# Patient Record
Sex: Female | Born: 1945
Health system: Southern US, Community
[De-identification: ages and names within clinical notes are randomized; demographics above are authoritative.]

## PROBLEM LIST (undated history)

## (undated) DIAGNOSIS — K648 Other hemorrhoids: Secondary | ICD-10-CM

## (undated) DIAGNOSIS — K219 Gastro-esophageal reflux disease without esophagitis: Secondary | ICD-10-CM

## (undated) DIAGNOSIS — E039 Hypothyroidism, unspecified: Secondary | ICD-10-CM

## (undated) DIAGNOSIS — C73 Malignant neoplasm of thyroid gland: Secondary | ICD-10-CM

## (undated) DIAGNOSIS — H409 Unspecified glaucoma: Secondary | ICD-10-CM

## (undated) DIAGNOSIS — E0590 Thyrotoxicosis, unspecified without thyrotoxic crisis or storm: Secondary | ICD-10-CM

## (undated) DIAGNOSIS — B029 Zoster without complications: Principal | ICD-10-CM

## (undated) HISTORY — DX: Gastro-esophageal reflux disease without esophagitis: K21.9

## (undated) HISTORY — DX: Thyrotoxicosis, unspecified without thyrotoxic crisis or storm: E05.90

## (undated) HISTORY — DX: Zoster without complications: B02.9

## (undated) HISTORY — DX: Other hemorrhoids: K64.8

## (undated) HISTORY — DX: Hypothyroidism, unspecified: E03.9

## (undated) HISTORY — DX: Unspecified glaucoma: H40.9

## (undated) HISTORY — DX: Malignant neoplasm of thyroid gland: C73

---

## 1985-06-04 HISTORY — PX: ABDOMINAL HYSTERECTOMY: SHX81

## 1999-03-16 ENCOUNTER — Encounter: Payer: Self-pay | Admitting: Neurosurgery

## 1999-03-16 ENCOUNTER — Ambulatory Visit (HOSPITAL_COMMUNITY): Admission: RE | Admit: 1999-03-16 | Discharge: 1999-03-16 | Payer: Self-pay | Admitting: Neurosurgery

## 1999-04-03 ENCOUNTER — Ambulatory Visit (HOSPITAL_COMMUNITY): Admission: RE | Admit: 1999-04-03 | Discharge: 1999-04-03 | Payer: Self-pay | Admitting: Neurosurgery

## 1999-04-03 ENCOUNTER — Encounter: Payer: Self-pay | Admitting: Neurosurgery

## 1999-04-17 ENCOUNTER — Encounter: Payer: Self-pay | Admitting: Neurosurgery

## 1999-04-17 ENCOUNTER — Ambulatory Visit (HOSPITAL_COMMUNITY): Admission: RE | Admit: 1999-04-17 | Discharge: 1999-04-17 | Payer: Self-pay | Admitting: Neurosurgery

## 1999-05-01 ENCOUNTER — Encounter: Payer: Self-pay | Admitting: Neurosurgery

## 1999-05-01 ENCOUNTER — Ambulatory Visit (HOSPITAL_COMMUNITY): Admission: RE | Admit: 1999-05-01 | Discharge: 1999-05-01 | Payer: Self-pay | Admitting: Neurosurgery

## 2000-10-31 ENCOUNTER — Other Ambulatory Visit: Admission: RE | Admit: 2000-10-31 | Discharge: 2000-10-31 | Payer: Self-pay | Admitting: Obstetrics and Gynecology

## 2001-11-25 ENCOUNTER — Other Ambulatory Visit: Admission: RE | Admit: 2001-11-25 | Discharge: 2001-11-25 | Payer: Self-pay | Admitting: *Deleted

## 2001-11-25 ENCOUNTER — Encounter: Admission: RE | Admit: 2001-11-25 | Discharge: 2001-11-25 | Payer: Self-pay | Admitting: *Deleted

## 2001-11-25 ENCOUNTER — Encounter: Payer: Self-pay | Admitting: *Deleted

## 2001-11-27 ENCOUNTER — Encounter: Payer: Self-pay | Admitting: *Deleted

## 2001-12-01 ENCOUNTER — Ambulatory Visit (HOSPITAL_COMMUNITY): Admission: RE | Admit: 2001-12-01 | Discharge: 2001-12-02 | Payer: Self-pay | Admitting: *Deleted

## 2001-12-01 ENCOUNTER — Encounter (INDEPENDENT_AMBULATORY_CARE_PROVIDER_SITE_OTHER): Payer: Self-pay | Admitting: *Deleted

## 2002-01-05 ENCOUNTER — Encounter: Payer: Self-pay | Admitting: *Deleted

## 2002-01-05 ENCOUNTER — Ambulatory Visit (HOSPITAL_COMMUNITY): Admission: RE | Admit: 2002-01-05 | Discharge: 2002-01-05 | Payer: Self-pay | Admitting: *Deleted

## 2002-01-09 ENCOUNTER — Encounter: Payer: Self-pay | Admitting: *Deleted

## 2002-01-09 ENCOUNTER — Ambulatory Visit (HOSPITAL_COMMUNITY): Admission: RE | Admit: 2002-01-09 | Discharge: 2002-01-09 | Payer: Self-pay | Admitting: *Deleted

## 2002-06-04 LAB — HM PAP SMEAR: HM Pap smear: NORMAL

## 2002-06-30 ENCOUNTER — Ambulatory Visit (HOSPITAL_COMMUNITY): Admission: RE | Admit: 2002-06-30 | Discharge: 2002-06-30 | Payer: Self-pay | Admitting: Gastroenterology

## 2002-06-30 DIAGNOSIS — K648 Other hemorrhoids: Secondary | ICD-10-CM

## 2002-06-30 HISTORY — DX: Other hemorrhoids: K64.8

## 2002-06-30 LAB — HM COLONOSCOPY

## 2004-10-23 ENCOUNTER — Encounter: Admission: RE | Admit: 2004-10-23 | Discharge: 2004-10-23 | Payer: Self-pay | Admitting: Family Medicine

## 2005-01-25 ENCOUNTER — Encounter: Admission: RE | Admit: 2005-01-25 | Discharge: 2005-01-25 | Payer: Self-pay | Admitting: Otolaryngology

## 2005-02-12 ENCOUNTER — Encounter: Admission: RE | Admit: 2005-02-12 | Discharge: 2005-02-12 | Payer: Self-pay | Admitting: Otolaryngology

## 2005-02-21 ENCOUNTER — Encounter: Admission: RE | Admit: 2005-02-21 | Discharge: 2005-02-21 | Payer: Self-pay | Admitting: Otolaryngology

## 2005-03-06 ENCOUNTER — Other Ambulatory Visit: Admission: RE | Admit: 2005-03-06 | Discharge: 2005-03-06 | Payer: Self-pay | Admitting: Obstetrics and Gynecology

## 2005-06-04 DIAGNOSIS — C73 Malignant neoplasm of thyroid gland: Secondary | ICD-10-CM

## 2005-06-04 HISTORY — DX: Malignant neoplasm of thyroid gland: C73

## 2005-09-13 ENCOUNTER — Encounter: Admission: RE | Admit: 2005-09-13 | Discharge: 2005-09-13 | Payer: Self-pay | Admitting: Otolaryngology

## 2005-12-02 DIAGNOSIS — E039 Hypothyroidism, unspecified: Secondary | ICD-10-CM

## 2005-12-02 HISTORY — PX: THYROIDECTOMY: SHX17

## 2005-12-02 HISTORY — DX: Hypothyroidism, unspecified: E03.9

## 2006-12-22 IMAGING — CR DG CHEST 2V
2 series · 2 of 2 positions shown · non-contrast
Comparison: Chest radiographs, 10/23/04.

CLINICAL DATA: Dry cough for several months.    Thyroid cancer history.
CHEST ? TWO VIEWS:

[view not recorded (1 of 2)]
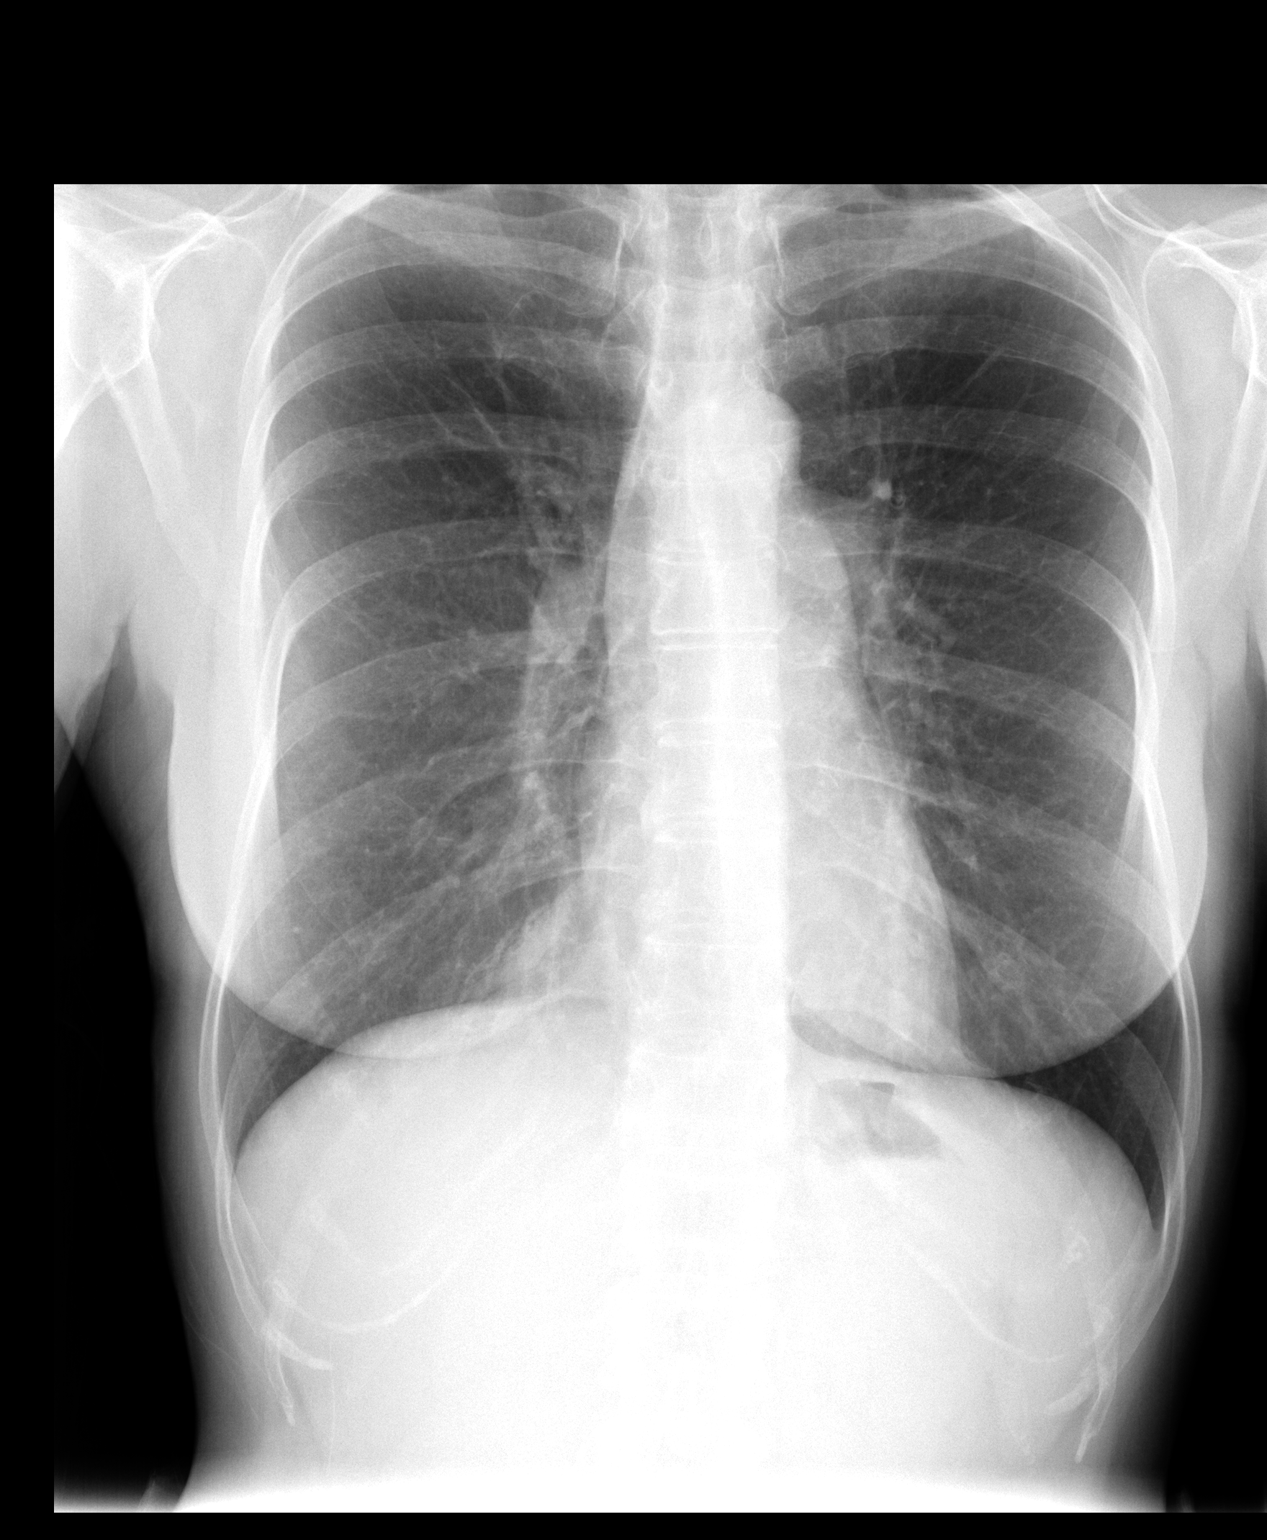

[view not recorded (2 of 2)]
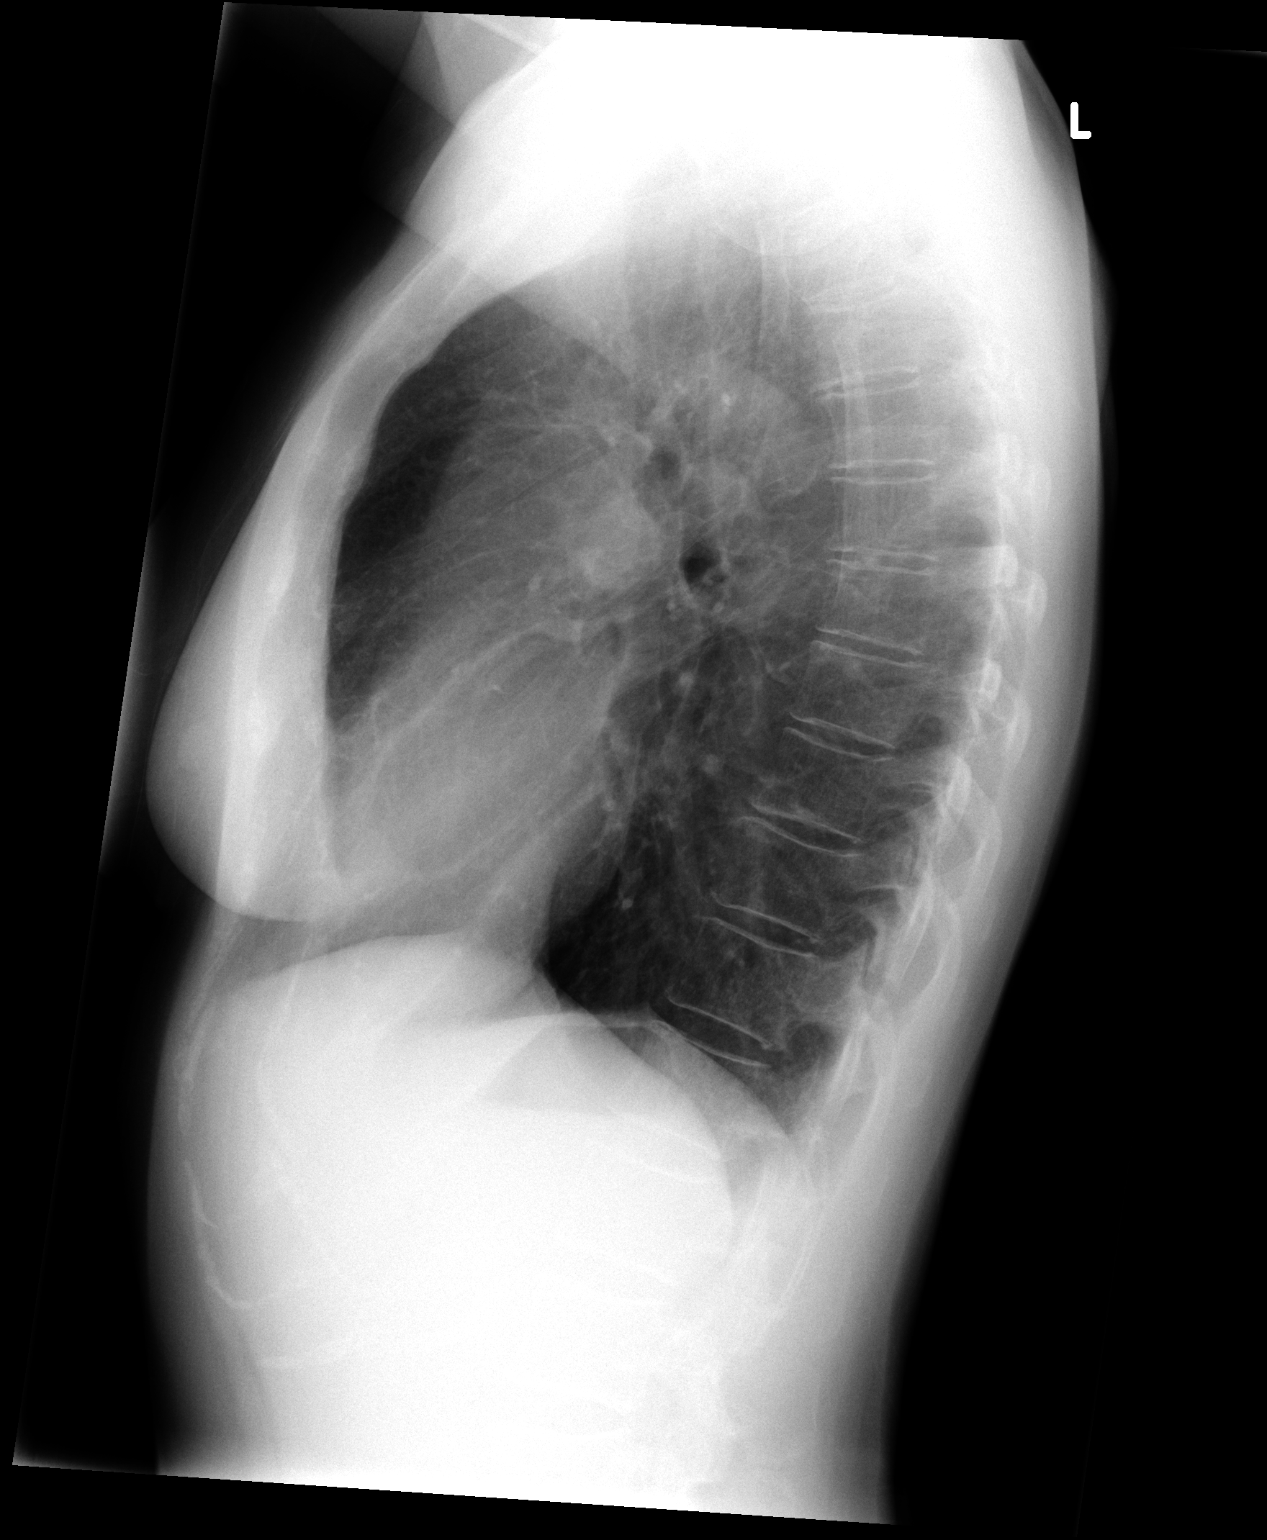

[2 of 2 positions shown; findings below may reference images not displayed]

FINDINGS: Cardiomediastinal silhouette is within normal limits.  Again noted is an opacity overlying the right cardiophrenic angle, unchanged.  No acute airspace opacity or pleural effusion is seen.  Osseous structures are intact.
IMPRESSION: 1.  No acute cardiopulmonary process. 
2.  Again noted is a mass-like opacity overlying the right cardiophrenic angle.  Although this may represent an epicardial fat pad, CT without contrast is again recommended for further characterization.

## 2006-12-25 ENCOUNTER — Encounter: Admission: RE | Admit: 2006-12-25 | Discharge: 2006-12-25 | Payer: Self-pay | Admitting: Otolaryngology

## 2009-06-22 ENCOUNTER — Encounter: Admission: RE | Admit: 2009-06-22 | Discharge: 2009-06-22 | Payer: Self-pay | Admitting: Gastroenterology

## 2009-09-02 ENCOUNTER — Encounter: Admission: RE | Admit: 2009-09-02 | Discharge: 2009-09-12 | Payer: Self-pay | Admitting: Endocrinology

## 2010-05-01 ENCOUNTER — Encounter (HOSPITAL_COMMUNITY)
Admission: RE | Admit: 2010-05-01 | Discharge: 2010-07-04 | Payer: Self-pay | Source: Home / Self Care | Attending: Endocrinology | Admitting: Endocrinology

## 2010-06-25 ENCOUNTER — Encounter: Payer: Self-pay | Admitting: Endocrinology

## 2010-10-20 NOTE — Op Note (Signed)
[facility]  Patient:    Barbara Parker, Barbara Parker Visit Number: [id number] MRN: [id number]          Service Type: DSU Location: [id number] Attending Physician:  Aundria Mems Dictated by:   Kathy Breach, M.D. Proc. Date: 12/01/01 Admit Date:  12/01/2001 Discharge Date: 12/02/2001                             Operative Report  PREOPERATIVE DIAGNOSIS:  Papillary carcinoma of thyroid.  OPERATIVE PROCEDURE:  Total thyroidectomy.  POSTOPERATIVE DIAGNOSIS:  Papillary carcinoma of thyroid per frozen section, which was in agreement with preoperative fine-needle aspirate.  FIRST SURGICAL ASSISTANT:   Jefry H. Pollyann Kennedy, M.D.  DESCRIPTION OF PROCEDURE:  With the patient under general orotracheal anesthesia, anterior neck, lower face, and upper chest were prepped and draped in a sterile fashion.  A curved, in a skin line, horizontal incision was made halfway between the cricoid cartilage and sternal notch just inferior to the palpable right thyroid lobe 2 cm nodule.  The incision was made through skin and subcutaneous tissue laterally platysma muscle.  Strap muscles were split vertically in the midline, elevating the strap muscles off of the right thyroid lobe beyond the capsule of the thyroid.  Vessels were clamped and tied with 4-0 silk ties as encountered dissecting the right thyroid lobe on its capsule.  The superior pole was dissected identifying, clamping, and divided superior thyroid artery and vessels.  The inferior thyroid vessel was divided at the inferior pole and the gland was rolled medial-wards, dissecting carefully on its undersurface identifying the recurrent laryngeal nerve in its expected position and keeping the dissection immediately on the capsule of the thyroid to avoid including parathyroid tissue.  The middle thyroid artery was clamped and tied in the immediate vicinity of the recurrent nerve.  The thyroid gland was rolled medially and was  sharply dissected off of the anterolateral tracheal wall having divided the thyroid isthmus at the junction of the isthmus with the left lobe.  The right lobe was submitted for frozen section to confirm suspected capillary carcinoma.  Frozen section reported this to be papillary carcinoma.  The left lobe of the thyroid was then removed in a similar fashion elevating the strap muscles off of the capsule of the left lobe of the thyroid, clamping, and tying with 4-0 silk ties the venous structures and individually, the inferior thyroid and superior thyroid vessels, rolling the left lobe medial-wards dissecting carefully on the deep surface of the thyroid gland capsule to avoid inclusion of parathyroid tissue.  The recurrent nerve was identified and preserved rolling the gland more medially separating it from the anterolateral tracheal wall. The wound was irrigated, hemostasis was complete.  A #7 Jackson-Pratt drain was placed through the wound and brought out through a small stab incision in the midline immediately below the incision line.  The strap muscles were reapproximated with interrupted 3-0 chromic catgut sutures.  The skin was then closed with a subcutaneous interrupted 3-0 chromic catgut sutures and then the skin was approximated with a running 4-0 nylon suture.  The skin margins were painted with benzoin and Steri-Strips were applied on the incision line. Blood loss for the procedure was estimated at 50 cc.  The patient tolerated the procedure well and was taken to the recovery room in stable general condition. Dictated by:   Kathy Breach, M.D. Attending Physician:  Aundria Mems DD:  12/01/01 TD:  12/02/01 Job: [id number]

## 2010-10-20 NOTE — Op Note (Signed)
Barbara Parker, Barbara Parker                              ACCOUNT NO.:  [id number]   MEDICAL RECORD NO.:  [id number]                   PATIENT TYPE:  AMB   LOCATION:  ENDO                                 FACILITY:  [facility]   PHYSICIAN:  Anselmo Rod, M.D.               DATE OF BIRTH:  September 29, 1945   DATE OF PROCEDURE:  06/30/2002  DATE OF DISCHARGE:                                 OPERATIVE REPORT   PROCEDURE:  Screening colonoscopy.   ENDOSCOPIST:  Anselmo Rod, M.D.   INSTRUMENT USED:  Olympus video colonoscope.   INDICATION FOR PROCEDURE:  A 65 year old white female with trace guaiac-  positive stool undergoing screening colonoscopy.  The patient has a history  of diarrhea alternating with constipation, question IBS.   PREPROCEDURE PREPARATION:  Informed consent was procured from the patient.  The patient had fasted for eight hours prior to the procedure and prepped  with a bottle of Miralax and Gatorade the night prior to the procedure.   PREPROCEDURE PHYSICAL:  VITAL SIGNS:  The patient had stable vital signs.  NECK:  Supple.  CHEST:  Clear to auscultation.  S1, S2 regular.  ABDOMEN:  Soft with normal bowel sounds.   DESCRIPTION OF PROCEDURE:  The patient was placed in the left lateral  decubitus position and sedated with 60 mg of Demerol and 6 mg of Versed  intravenously.  Once the patient was adequately sedate and maintained on low-  flow oxygen and continuous cardiac monitoring, the Olympus video colonoscope  was advanced from the rectum to the cecum and terminal ileum without  difficulty.  Except for small, nonbleeding internal hemorrhoids seen on  retroflexion, no other abnormalities were noted.  The patient tolerated the  procedure well without complications.  No masses or polyps were present.   IMPRESSION:  Normal colonoscopy up to the terminal ileum except for small,  nonbleeding internal hemorrhoids.   RECOMMENDATIONS:  1. A high-fiber diet has been recommended for  the patient along with liberal     fluid intake.  2.     Repeat CRC screening is recommended in the next five years unless the     patient develops any abnormal symptoms in the interim.  3. Outpatient follow-up on a p.r.n. basis.                                                 Anselmo Rod, M.D.    JNM/MEDQ  D:  07/01/2002  T:  07/01/2002  Job:  [id number]   cc:   Lacretia Leigh. Quintella Reichert, M.D.  [facility]  [place]  [place] [address]  Fax: [phone]   Laqueta Linden, M.D.  [address]  [place]  [place] [address]  Fax: [phone]

## 2011-03-26 ENCOUNTER — Encounter: Payer: Self-pay | Admitting: Medical

## 2011-03-26 ENCOUNTER — Ambulatory Visit (INDEPENDENT_AMBULATORY_CARE_PROVIDER_SITE_OTHER): Payer: BC Managed Care – PPO | Admitting: Medical

## 2011-03-26 VITALS — BP 100/70 | HR 68 | Temp 98.3°F | Resp 18 | Wt 131.0 lb

## 2011-03-26 DIAGNOSIS — J329 Chronic sinusitis, unspecified: Secondary | ICD-10-CM

## 2011-03-26 DIAGNOSIS — J40 Bronchitis, not specified as acute or chronic: Secondary | ICD-10-CM

## 2011-03-26 MED ORDER — BENZONATATE 100 MG PO CAPS: 100.0000 mg | ORAL_CAPSULE | Freq: Four times a day (QID) | ORAL | Status: DC | PRN

## 2011-03-26 MED ORDER — AMOXICILLIN 875 MG PO TABS: 875.0000 mg | ORAL_TABLET | Freq: Two times a day (BID) | ORAL | Status: AC

## 2011-03-26 NOTE — Progress Notes (Signed)
Subjective:   HPI  Barbara Parker is a 65 y.o. female who presents as a new patient with cold and cough.  She is scheduled to see Dr. Lynelle Doctor here to establish next week, but came down with these symptoms in the meantime.  Started with cold symptoms 2 weeks ago, but in the last few days worse cough, worse hacking all night long, feels tight in chest at times with the cough.  Has sinus pressure and having yellow green nasal discharge and sputum.  She notes some dull sinus pressure, some earache.  Using Sudafed, Mucinex, Benadryl, but nothing helps.   No sick contacts.  No other aggravating or relieving factors.    No other c/o.  Allergies  Allergen Reactions  . Codeine   . Prednisone     Nausea and vomiting    Pertinent past medical history: no hx/o allergies, asthma  Pertinent social history: nonsmoker  Reviewed their medical, surgical, family, social, medication, and allergy history and updated chart as appropriate.   The following portions of the patient's history were reviewed and updated as appropriate: allergies, current medications, past family history, past medical history, past social history, past surgical history and problem list.  Review of Systems Constitutional: -fever, -chills, -sweats, -unexpected -weight change,-fatigue ENT: +runny nose, +ear pain, -sore throat Cardiology:  -chest pain, -palpitations, -edema Respiratory: +cough, +shortness of breath, +wheezing Gastroenterology: -abdominal pain, +nausea, +vomiting, -diarrhea, -constipation Hematology: -bleeding or bruising problems Musculoskeletal: -arthralgias, -myalgias, -joint swelling, -back pain Ophthalmology: -vision changes Urology: -dysuria, -difficulty urinating, -hematuria, -urinary frequency, -urgency Neurology: +headache, -weakness, -tingling, -numbness    Objective:   Physical Exam  Filed Vitals:   03/26/11 1551  BP: 100/70  Pulse: 68  Temp: 98.3 F (36.8 C)  Resp: 18    General appearance:  alert, no distress, WD/WN, mildly ill appearing HEENT: normocephalic, sclerae anicteric, conjunctiva pink and moist, TMs pearly, nares with slight erythema and clear discharge mild erythema, pharynx normal, tonsils unremarkable Oral cavity: MMM, no lesions Neck: supple, no lymphadenopathy, no thyromegaly, no masses Heart: RRR, normal S1, S2, no murmurs Lungs: CTA bilaterally, no wheezes, rhonchi, or rales Pulses: 2+ symmetric   Assessment and Plan :    Encounter Diagnoses  Name Primary?  . Sinusitis Yes  . Bronchitis    Script for [employer] for cough, can c/t OTC Mucinex DM or Zyrtec, discussed symptomatic therapy, and call if not improving in 3-5 days.    Follow-up as scheduled next week with Dr. Lynelle Doctor for new patient/establish visit.

## 2011-03-26 NOTE — Patient Instructions (Signed)

## 2011-04-09 ENCOUNTER — Encounter: Payer: Self-pay | Admitting: Family Medicine

## 2011-04-09 ENCOUNTER — Ambulatory Visit (INDEPENDENT_AMBULATORY_CARE_PROVIDER_SITE_OTHER): Payer: BC Managed Care – PPO | Admitting: Family Medicine

## 2011-04-09 DIAGNOSIS — M810 Age-related osteoporosis without current pathological fracture: Secondary | ICD-10-CM

## 2011-04-09 DIAGNOSIS — R5381 Other malaise: Secondary | ICD-10-CM

## 2011-04-09 DIAGNOSIS — Z79899 Other long term (current) drug therapy: Secondary | ICD-10-CM

## 2011-04-09 DIAGNOSIS — Z23 Encounter for immunization: Secondary | ICD-10-CM

## 2011-04-09 DIAGNOSIS — Z0000 Encounter for general adult medical examination without abnormal findings: Secondary | ICD-10-CM

## 2011-04-09 DIAGNOSIS — R5383 Other fatigue: Secondary | ICD-10-CM

## 2011-04-09 LAB — CBC WITH DIFFERENTIAL/PLATELET
Basophils Absolute: 0 10*3/uL (ref 0.0–0.1)
Eosinophils Absolute: 0.1 10*3/uL (ref 0.0–0.7)
Eosinophils Relative: 1 % (ref 0–5)
Hemoglobin: 13.3 g/dL (ref 12.0–15.0)
Monocytes Absolute: 0.8 10*3/uL (ref 0.1–1.0)
Monocytes Relative: 9 % (ref 3–12)
Neutro Abs: 6.7 10*3/uL (ref 1.7–7.7)
RDW: 13.3 % (ref 11.5–15.5)

## 2011-04-09 LAB — LIPID PANEL
HDL: 87 mg/dL (ref >39)
Triglycerides: 53 mg/dL (ref <150)

## 2011-04-09 LAB — POCT URINALYSIS DIPSTICK
Glucose, UA: NEGATIVE
Ketones, UA: NEGATIVE
Nitrite, UA: NEGATIVE
Spec Grav, UA: 1.02
pH, UA: 5

## 2011-04-09 LAB — COMPREHENSIVE METABOLIC PANEL
ALT: 26 U/L (ref 0–35)
AST: 29 U/L (ref 0–37)
Albumin: 4.2 g/dL (ref 3.5–5.2)
Sodium: 140 mEq/L (ref 135–145)

## 2011-04-09 LAB — MAGNESIUM: Magnesium: 2.1 mg/dL (ref 1.5–2.5)

## 2011-04-09 NOTE — Patient Instructions (Signed)
HEALTH MAINTENANCE RECOMMENDATIONS:  It is recommended that you get at least 30 minutes of aerobic exercise at least 5 days/week (for weight loss, you may need as much as 60-90 minutes). This can be any activity that gets your heart rate up. This can be divided in 10-15 minute intervals if needed, but try and build up your endurance at least once a week.  Weight bearing exercise is also recommended twice weekly.  Eat a healthy diet with lots of vegetables, fruits and fiber.  "Colorful" foods have a lot of vitamins (ie green vegetables, tomatoes, red peppers, etc).  Limit sweet tea, regular sodas and alcoholic beverages, all of which has a lot of calories and sugar.  Up to 1 alcoholic drink daily may be beneficial for women (unless trying to lose weight, watch sugars).  Drink a lot of water.  Calcium recommendations are 1200-1500 mg daily (1500 mg for postmenopausal women or women without ovaries), and vitamin D 1000 IU daily.  This should be obtained from diet and/or supplements (vitamins), and calcium should not be taken all at once, but in divided doses.  Monthly self breast exams and yearly mammograms for women over the age of 35 is recommended.  Sunscreen of at least SPF 30 should be used on all sun-exposed parts of the skin when outside between the hours of 10 am and 4 pm (not just when at beach or pool, but even with exercise, golf, tennis, and yard work!)  Use a sunscreen that says "broad spectrum" so it covers both UVA and UVB rays, and make sure to reapply every 1-2 hours.  Remember to change the batteries in your smoke detectors when changing your clock times in the spring and fall.  Use your seat belt every time you are in a car, and please drive safely and not be distracted with cell phones and texting while driving.  Shingles vaccine--please check your insurance coverage, and schedule a nurse visit if desired.

## 2011-04-09 NOTE — Progress Notes (Signed)
Barbara Parker is a 65 y.o. female who presents for a complete physical.  She has the following concerns: Questions regarding immunizations, no other concerns Osteoporosis (vs osteopenia)--not taking calcium or D supplements.  Last DEXA 5-7 years ago  Immunization History  Administered Date(s) Administered  . Influenza Split 04/09/2011  . Tdap 04/09/2011  Believes last tetanus was >10 years ago Last Pap smear: 2004 Last mammogram: 03/2010, plans to schedule Last colonoscopy: 2007, Dr. Loreta Ave Last DEXA: 5-7 years ago (abnl per pt, ?osteopenia vs osteoporosis--pt doesn't know), last done at [facility] Dentist: twice yearly Ophtho: yearly Exercise: gym 2-3 times week; zumba and machines  Past Medical History  Diagnosis Date  . Hypothyroidism (acquired) 12/2005    history of thyroid cancer and thyroidectomy; Dr. Talmage Nap  . GERD (gastroesophageal reflux disease)   . Osteoporosis     Past Surgical History  Procedure Date  . Abdominal hysterectomy 1987    still has ovaries.  . Thyroidectomy 12/2005    History   Social History  . Marital Status: Married    Spouse Name: N/A    Number of Children: 2  . Years of Education: N/A   Occupational History  . works for [employer] (part-time)    Social History Main Topics  . Smoking status: Never Smoker   . Smokeless tobacco: Never Used  . Alcohol Use: Yes     glass or two of wine maybe twice a year.  . Drug Use: No  . Sexually Active: Yes -- Female partner(s)   Other Topics Concern  . Not on file   Social History Narrative   Lives with her husband.  Son in [place], Daughter in [place]. 6 grandkids    Family History  Problem Relation Age of Onset  . Dementia Mother   . Cancer Maternal Grandmother 71    breast cancer  . Cancer Paternal Grandmother     pancreatic    Current outpatient prescriptions:dexlansoprazole (DEXILANT) 60 MG capsule, Take 60 mg by mouth daily.  , Disp: , Rfl: ;  levothyroxine (SYNTHROID) 175 MCG tablet, Take  175 mcg by mouth daily.  , Disp: , Rfl:   Allergies  Allergen Reactions  . Codeine Anaphylaxis  . Prednisone     Nausea and vomiting   ROS: The patient denies anorexia, fever, weight changes, headaches,  vision changes, decreased hearing, ear pain, sore throat, breast concerns, chest pain, palpitations, dizziness, syncope, dyspnea on exertion, cough, swelling, nausea, vomiting, diarrhea, constipation, abdominal pain, melena, hematochezia, indigestion/heartburn, hematuria, incontinence, dysuria,  vaginal bleeding, discharge, odor or itch, genital lesions, joint pains, numbness, tingling, weakness, tremor, suspicious skin lesions, depression, anxiety, abnormal bleeding/bruising, or enlarged lymph nodes. +rare tingling in feet or hands, depending on position +hacky cough prior to being on PPI.  Denies heartburn.  Takes Dexilant every other day without any recurrence of cough. Some chronic constipation  PHYSICAL EXAM: BP 98/60  Pulse 64  Ht 5\' 6"  (1.676 m)  Wt 129 lb (58.514 kg)  BMI 20.82 kg/m2  General Appearance:    Alert, cooperative, no distress, appears stated age  Head:    Normocephalic, without obvious abnormality, atraumatic  Eyes:    PERRL, conjunctiva/corneas clear, EOM's intact, fundi    benign  Ears:    Normal TM's and external ear canals  Nose:   Nares normal, mucosa normal, no drainage or sinus   tenderness  Throat:   Lips, mucosa, and tongue normal; teeth and gums normal  Neck:   Supple, no lymphadenopathy;  thyroid:  no   enlargement/tenderness/nodules; no carotid   bruit or JVD  Back:    Spine nontender, no curvature, ROM normal, no CVA     tenderness  Lungs:     Clear to auscultation bilaterally without wheezes, rales or     ronchi; respirations unlabored  Chest Wall:    No tenderness or deformity   Heart:    Regular rate and rhythm, S1 and S2 normal, no murmur, rub   or gallop  Breast Exam:    No tenderness, masses, or nipple discharge or inversion.      No axillary  lymphadenopathy  Abdomen:     Soft, non-tender, nondistended, normoactive bowel sounds,    no masses, no hepatosplenomegaly  Genitalia:    Normal external genitalia without lesions.  BUS and vagina normal;   Uterus absent;  and adnexa not enlarged, not palpable, nontender, no masses.  Pap not performed  Rectal:    Normal tone, no masses or tenderness; guaiac negative stool  Extremities:   No clubbing, cyanosis or edema  Pulses:   2+ and symmetric all extremities  Skin:   Skin color, texture, turgor normal, no rashes or lesions  Lymph nodes:   Cervical, supraclavicular, and axillary nodes normal  Neurologic:   CNII-XII intact, normal strength, sensation and gait; reflexes 2+ and symmetric throughout          Psych:   Normal mood, affect, hygiene and grooming.    ASSESSMENT/PLAN:  1. Routine general medical examination at a health care facility  POCT Urinalysis Dipstick, Visual acuity screening, Lipid panel  2. Need for prophylactic vaccination and inoculation against influenza  Flu vaccine greater than or equal to 3yo preservative free IM  3. Need for Tdap vaccination  Tdap vaccine greater than or equal to 7yo IM  4. Osteoporosis  DG Bone Density, Vitamin D 25 hydroxy  5. Other malaise and fatigue  CBC with Differential, Comprehensive metabolic panel  6. Encounter for long-term (current) use of other medications  Magnesium   H/o osteoporosis (vs osteopenia)--DEXA at [facility].  Vitamin D and calcium recommendations reviewed.  Dietary sources reviewed.  GERD--controlled with PPI.  Continue every other day, or less if able.  Reviewed longterm risks of both untreated reflux, as well as longterm risks of PPI's.  Discussed monthly self breast exams and yearly mammograms; at least 30 minutes of aerobic activity at least 5 days/week; proper sunscreen use reviewed; healthy diet, including goals of calcium and vitamin D intake and alcohol recommendations (less than or equal to 1 drink/day) reviewed;  regular seatbelt use; changing batteries in smoke detectors.  Immunization recommendations discussed--Tdap and flu shot given.  Risks/benefits of Zostavax reviewed, and advised patient to check with her insurance regarding coverage. Pneumovax at age 21.  Colonoscopy recommendations reviewed--UTD. Hemoccult kit given

## 2011-04-10 ENCOUNTER — Encounter: Payer: Self-pay | Admitting: Family Medicine

## 2011-04-11 ENCOUNTER — Encounter: Payer: Self-pay | Admitting: Family Medicine

## 2011-04-19 ENCOUNTER — Other Ambulatory Visit (INDEPENDENT_AMBULATORY_CARE_PROVIDER_SITE_OTHER): Payer: BC Managed Care – PPO

## 2011-04-19 DIAGNOSIS — Z0000 Encounter for general adult medical examination without abnormal findings: Secondary | ICD-10-CM

## 2011-04-19 LAB — POC HEMOCCULT BLD/STL (HOME/3-CARD/SCREEN): Fecal Occult Blood, POC: NEGATIVE

## 2011-04-19 NOTE — Progress Notes (Signed)
Addended by: Leretha Dykes L on: 04/19/2011 12:17 PM   Modules accepted: Orders

## 2011-05-02 ENCOUNTER — Encounter: Payer: Self-pay | Admitting: *Deleted

## 2011-05-02 ENCOUNTER — Telehealth: Payer: Self-pay | Admitting: *Deleted

## 2011-05-02 NOTE — Telephone Encounter (Signed)
Left message for patient to call office and schedule OV with Dr.Knapp to discuss treatment options for osteoporosis found on DEXA.

## 2011-05-15 ENCOUNTER — Other Ambulatory Visit (INDEPENDENT_AMBULATORY_CARE_PROVIDER_SITE_OTHER): Payer: BC Managed Care – PPO

## 2011-05-15 DIAGNOSIS — Z2911 Encounter for prophylactic immunotherapy for respiratory syncytial virus (RSV): Secondary | ICD-10-CM

## 2011-05-15 DIAGNOSIS — Z0000 Encounter for general adult medical examination without abnormal findings: Secondary | ICD-10-CM

## 2011-07-16 ENCOUNTER — Encounter: Payer: Self-pay | Admitting: Family Medicine

## 2011-07-16 ENCOUNTER — Ambulatory Visit (INDEPENDENT_AMBULATORY_CARE_PROVIDER_SITE_OTHER): Payer: BC Managed Care – PPO | Admitting: Family Medicine

## 2011-07-16 DIAGNOSIS — R195 Other fecal abnormalities: Secondary | ICD-10-CM

## 2011-07-16 DIAGNOSIS — G4700 Insomnia, unspecified: Secondary | ICD-10-CM

## 2011-07-16 DIAGNOSIS — M810 Age-related osteoporosis without current pathological fracture: Secondary | ICD-10-CM

## 2011-07-16 MED ORDER — ZOLPIDEM TARTRATE 5 MG PO TABS: 5.0000 mg | ORAL_TABLET | Freq: Every evening | ORAL | Status: DC | PRN

## 2011-07-16 MED ORDER — CIPROFLOXACIN HCL 500 MG PO TABS: 500.0000 mg | ORAL_TABLET | Freq: Two times a day (BID) | ORAL | Status: AC

## 2011-07-16 MED ORDER — RISEDRONATE SODIUM 150 MG PO TABS: 150.0000 mg | ORAL_TABLET | ORAL | Status: DC

## 2011-07-16 NOTE — Progress Notes (Signed)
Patient presents to discuss her DEXA results.  Done at [facility] 04/18/2011.  Showed osteoporosis in spine and hip. She has never taken meds for osteoporosis, and admits to being afraid of them.  Had a friend who had fosamax "burn a whole in her esophagus".  Denies dysphagia, and reflux is well controlled on medications.  Also wanted to discuss her heme+ stool card (1/3 were positive).  She had colonoscopy 06/30/02 which showed small, nonbleeding internal hemorrhoids.  CBC was normal recently.  Denies seeing any blood in her stool, and denies any change in bowel habits.  Going to [place] on a mission trip with church.  In the past she has taken Ambien and Cipro (for potential GI illness) on trip.  She has taken both of these before without any problems. Has gotten the 5 mg of Ambien in the past. She states vaccines are UTD, has been to health dept in past for travel vaccines.  Past Medical History  Diagnosis Date  . Hypothyroidism (acquired) 12/2005    history of thyroid cancer and thyroidectomy; Dr. Talmage Nap  . GERD (gastroesophageal reflux disease)   . Osteoporosis   . Internal hemorrhoids 06/30/2002    Dr.Mann    Past Surgical History  Procedure Date  . Abdominal hysterectomy 1987    still has ovaries.  . Thyroidectomy 12/2005    History   Social History  . Marital Status: Married    Spouse Name: N/A    Number of Children: 2  . Years of Education: N/A   Occupational History  . works for [employer] (part-time)    Social History Main Topics  . Smoking status: Never Smoker   . Smokeless tobacco: Never Used  . Alcohol Use: Yes     glass or two of wine maybe twice a year.  . Drug Use: No  . Sexually Active: Yes -- Female partner(s)   Other Topics Concern  . Not on file   Social History Narrative   Lives with her husband.  Son in [place], Daughter in [place]. 6 grandkids    Family History  Problem Relation Age of Onset  . Dementia Mother   . Cancer Maternal Grandmother 34      breast cancer  . Cancer Paternal Grandmother     pancreatic   Meds: Synthroid and Dexilant  Allergies  Allergen Reactions  . Codeine Anaphylaxis  . Prednisone     Nausea and vomiting   ROS:  Denies fevers, URI symptoms, cough, shortness of breath, chest pain, dysphagia, heartburn, change in bowel habits, abdominal pain, or other concern.  PHYSICAL EXAM: BP 120/60  Pulse 64  Ht 5\' 6"  (1.676 m)  Wt 133 lb (60.328 kg)  BMI 21.47 kg/m2 Well developed, pleasant, thin female in no distress Remainder of exam was limited to discussion counseling.  ASSESSMENT/PLAN: 1. Osteoporosis  risedronate (ACTONEL) 150 MG tablet  2. Insomnia  zolpidem (AMBIEN) 5 MG tablet  3. Heme + stool     Reviewed risks and benefits of bisphosphonates and Evista.  Willing to try a monthly bisphosphonate--Actonel samples given.  Discussed proper way to take.  Will call if she is having side effects. Reviewed calcium and vitamin D recommendations (previously had normal Vitamin D level), and regular weight-bearing exercise.  1/3 stool cards +, with normal CBC and known hemorrhoids.  Continue with plan for colonoscopy in 2014, as previously recommended. Recommend it be done early if having any ongoing/worsening blood in stool, change in bowel habits, weight loss, or other symptoms/concerns.  Cipro to have prn traveler's diarrhea or UTI Ambien 5mg  as needed, aware of side effects  Discussed decongestants, Mucinex and sinus rinses prn for congestion if she gets sick while away.

## 2011-07-16 NOTE — Patient Instructions (Signed)
Remember to take Actonel on empty stomach, with a full glass of water.  Do not eat or recline for 30-60 minutes after, in order to adequately absorb medications, and reduce risk for side effects or adverse consequences.  If you find that Barbara Parker is less expensive, then call and we can send Rx to pharmacy.  Otherwise, try the two samples and if you are tolerating without side effects, call for prescription to be sent to pharmacy

## 2011-07-17 DIAGNOSIS — M810 Age-related osteoporosis without current pathological fracture: Secondary | ICD-10-CM | POA: Insufficient documentation

## 2011-10-04 ENCOUNTER — Emergency Department (INDEPENDENT_AMBULATORY_CARE_PROVIDER_SITE_OTHER): Payer: BC Managed Care – PPO

## 2011-10-04 ENCOUNTER — Encounter (HOSPITAL_COMMUNITY): Payer: Self-pay | Admitting: *Deleted

## 2011-10-04 ENCOUNTER — Emergency Department (HOSPITAL_COMMUNITY)
Admission: EM | Admit: 2011-10-04 | Discharge: 2011-10-04 | Disposition: A | Discharge status: Home / Self Care | Payer: BC Managed Care – PPO | Source: Home / Self Care | Attending: Family Medicine | Admitting: Family Medicine

## 2011-10-04 DIAGNOSIS — S8002XA Contusion of left knee, initial encounter: Secondary | ICD-10-CM

## 2011-10-04 DIAGNOSIS — W010XXA Fall on same level from slipping, tripping and stumbling without subsequent striking against object, initial encounter: Secondary | ICD-10-CM

## 2011-10-04 DIAGNOSIS — S8000XA Contusion of unspecified knee, initial encounter: Secondary | ICD-10-CM

## 2011-10-04 MED ORDER — TRAMADOL HCL 50 MG PO TABS: 50.0000 mg | ORAL_TABLET | Freq: Four times a day (QID) | ORAL | Status: AC | PRN

## 2011-10-04 NOTE — ED Provider Notes (Signed)
History     CSN: [id number]  Arrival date & time 10/04/11  5284   First MD Initiated Contact with Patient 10/04/11 1858      Chief Complaint  Patient presents with  . Knee Injury    (Consider location/radiation/quality/duration/timing/severity/associated sxs/prior treatment) Patient is a 66 y.o. female presenting with knee pain. The history is provided by the patient.  Knee Pain This is a new problem. The current episode started 1 to 2 hours ago (slipped in kitchen on wet spot forward onto left knee, pain since.). The problem has not changed since onset.   Past Medical History  Diagnosis Date  . Hypothyroidism (acquired) 12/2005    history of thyroid cancer and thyroidectomy; Dr. Talmage Nap  . GERD (gastroesophageal reflux disease)   . Osteoporosis   . Internal hemorrhoids 06/30/2002    Dr.Mann    Past Surgical History  Procedure Date  . Abdominal hysterectomy 1987    still has ovaries.  . Thyroidectomy 12/2005    Family History  Problem Relation Age of Onset  . Dementia Mother   . Cancer Maternal Grandmother 68    breast cancer  . Cancer Paternal Grandmother     pancreatic    History  Substance Use Topics  . Smoking status: Never Smoker   . Smokeless tobacco: Never Used  . Alcohol Use: Yes     glass or two of wine maybe twice a year.    OB History    Grav Para Term Preterm Abortions TAB SAB Ect Mult Living   2 2 2              Review of Systems  Constitutional: Negative.   Gastrointestinal: Negative.   Musculoskeletal: Positive for joint swelling and gait problem. Negative for back pain.  Neurological: Negative.     Allergies  Codeine and Prednisone  Home Medications   Current Outpatient Rx  Name Route Sig Dispense Refill  . DEXLANSOPRAZOLE 60 MG PO CPDR Oral Take 60 mg by mouth daily.      Marland Kitchen LEVOTHYROXINE SODIUM 175 MCG PO TABS Oral Take 175 mcg by mouth daily.      Marland Kitchen RISEDRONATE SODIUM 150 MG PO TABS Oral Take 1 tablet (150 mg total) by mouth every  30 (thirty) days. with water on empty stomach, nothing by mouth or lie down for next 30 minutes. 2 tablet 0  . TRAMADOL HCL 50 MG PO TABS Oral Take 1 tablet (50 mg total) by mouth every 6 (six) hours as needed for pain. 20 tablet 0  . ZOLPIDEM TARTRATE 5 MG PO TABS Oral Take 1 tablet (5 mg total) by mouth at bedtime as needed for sleep. 15 tablet 0    BP 103/64  Pulse 64  Temp(Src) 98 F (36.7 C) (Oral)  Resp 16  SpO2 100%  Physical Exam  Nursing note and vitals reviewed. Constitutional: She is oriented to person, place, and time. She appears well-developed and well-nourished.  Musculoskeletal: She exhibits tenderness.       Legs: Neurological: She is alert and oriented to person, place, and time.  Skin: Skin is warm and dry.    ED Course  Procedures (including critical care time)  Labs Reviewed - No data to display Dg Knee Complete 4 Views Left  10/04/2011  *RADIOLOGY REPORT*  Clinical Data: Larey Seat.  Left knee pain.  LEFT KNEE - COMPLETE 4+ VIEW  Comparison: None  Findings: The joint spaces are maintained.  No acute fracture.  No osteochondral lesion.  No significant  degenerative changes.  A small joint effusion is suspected.  IMPRESSION: No acute bony findings or significant degenerative changes. Small joint effusion.  Original Report Authenticated By: P. Loralie Champagne, M.D.     1. Contusion of left knee, initial encounter       MDM  X-rays reviewed and report per radiologist.         Linna Hoff, MD 10/05/11 [phone]

## 2011-10-04 NOTE — ED Notes (Signed)
Pt  Fell  About 1  Hour ago  And  Injured her  l  Knee  She  Has  Pain and  Swelling         No  Other  Injury  Noted     She  Is  Awake  And  Alert  Unable  To  Bear  Weight  Due  To pain

## 2011-10-04 NOTE — Discharge Instructions (Signed)
Wear knee brace when walking , use ice and pain medicine as needed for soreness and swelling, see orthopedist next week for recheck,

## 2012-03-25 ENCOUNTER — Other Ambulatory Visit (INDEPENDENT_AMBULATORY_CARE_PROVIDER_SITE_OTHER): Payer: BC Managed Care – PPO

## 2012-03-25 DIAGNOSIS — Z23 Encounter for immunization: Secondary | ICD-10-CM

## 2012-04-11 ENCOUNTER — Encounter: Payer: Self-pay | Admitting: Internal Medicine

## 2012-04-11 LAB — HM COLONOSCOPY: HM Colonoscopy: NORMAL

## 2012-08-02 ENCOUNTER — Emergency Department (HOSPITAL_COMMUNITY)
Admission: EM | Admit: 2012-08-02 | Discharge: 2012-08-02 | Disposition: A | Discharge status: Home / Self Care | Payer: BC Managed Care – PPO | Attending: Emergency Medicine | Admitting: Emergency Medicine

## 2012-08-02 ENCOUNTER — Encounter (HOSPITAL_COMMUNITY): Payer: Self-pay | Admitting: Emergency Medicine

## 2012-08-02 DIAGNOSIS — Z8679 Personal history of other diseases of the circulatory system: Secondary | ICD-10-CM | POA: Insufficient documentation

## 2012-08-02 DIAGNOSIS — K219 Gastro-esophageal reflux disease without esophagitis: Secondary | ICD-10-CM | POA: Insufficient documentation

## 2012-08-02 DIAGNOSIS — Z79899 Other long term (current) drug therapy: Secondary | ICD-10-CM | POA: Insufficient documentation

## 2012-08-02 DIAGNOSIS — R197 Diarrhea, unspecified: Secondary | ICD-10-CM | POA: Insufficient documentation

## 2012-08-02 DIAGNOSIS — Z8739 Personal history of other diseases of the musculoskeletal system and connective tissue: Secondary | ICD-10-CM | POA: Insufficient documentation

## 2012-08-02 DIAGNOSIS — E039 Hypothyroidism, unspecified: Secondary | ICD-10-CM | POA: Insufficient documentation

## 2012-08-02 DIAGNOSIS — R112 Nausea with vomiting, unspecified: Secondary | ICD-10-CM | POA: Insufficient documentation

## 2012-08-02 LAB — BASIC METABOLIC PANEL
BUN: 26 mg/dL — ABNORMAL HIGH (ref 6–23)
Calcium: 9.6 mg/dL (ref 8.4–10.5)
Creatinine, Ser: 0.72 mg/dL (ref 0.50–1.10)
GFR calc Af Amer: >90 mL/min (ref >90)
GFR calc non Af Amer: 89 mL/min — ABNORMAL LOW (ref >90)
Glucose, Bld: 122 mg/dL — ABNORMAL HIGH (ref 70–99)

## 2012-08-02 LAB — CBC WITH DIFFERENTIAL/PLATELET
Basophils Relative: 0 % (ref 0–1)
Eosinophils Absolute: 0 10*3/uL (ref 0.0–0.7)
Eosinophils Relative: 0 % (ref 0–5)
Hemoglobin: 15.2 g/dL — ABNORMAL HIGH (ref 12.0–15.0)
Lymphs Abs: 0.3 10*3/uL — ABNORMAL LOW (ref 0.7–4.0)
MCH: 31 pg (ref 26.0–34.0)
MCHC: 36.3 g/dL — ABNORMAL HIGH (ref 30.0–36.0)
MCV: 85.3 fL (ref 78.0–100.0)
Monocytes Absolute: 0.9 10*3/uL (ref 0.1–1.0)
Monocytes Relative: 6 % (ref 3–12)
Neutrophils Relative %: 91 % — ABNORMAL HIGH (ref 43–77)
RBC: 4.91 MIL/uL (ref 3.87–5.11)

## 2012-08-02 MED ORDER — SODIUM CHLORIDE 0.9 % IV BOLUS (SEPSIS)
1000.0000 mL | Freq: Once | INTRAVENOUS | Status: AC
  Administered 2012-08-02: 1000 mL via INTRAVENOUS

## 2012-08-02 MED ORDER — LOPERAMIDE HCL 2 MG PO CAPS
4.0000 mg | ORAL_CAPSULE | Freq: Once | ORAL | Status: AC
  Administered 2012-08-02: 4 mg via ORAL
  Filled 2012-08-02: qty 2

## 2012-08-02 MED ORDER — SODIUM CHLORIDE 0.9 % IV SOLN
1000.0000 mL | Freq: Once | INTRAVENOUS | Status: AC
Start: 2012-08-02 — End: 2012-08-02
  Administered 2012-08-02: 1000 mL via INTRAVENOUS

## 2012-08-02 MED ORDER — ONDANSETRON 8 MG PO TBDP: ORAL_TABLET | ORAL | Status: DC

## 2012-08-02 MED ORDER — ONDANSETRON HCL 4 MG/2ML IJ SOLN
4.0000 mg | INTRAMUSCULAR | Status: DC | PRN
  Administered 2012-08-02 (×2): 4 mg via INTRAVENOUS
  Filled 2012-08-02 (×2): qty 2

## 2012-08-02 MED ORDER — SODIUM CHLORIDE 0.9 % IV SOLN
1000.0000 mL | Freq: Once | INTRAVENOUS | Status: AC
  Administered 2012-08-02: 1000 mL via INTRAVENOUS

## 2012-08-02 MED ORDER — SODIUM CHLORIDE 0.9 % IV SOLN
1000.0000 mL | INTRAVENOUS | Status: DC
  Administered 2012-08-02: 1000 mL via INTRAVENOUS

## 2012-08-02 NOTE — ED Notes (Signed)
PT. REPORTS VOMITTING WITH DIARRHEA AND CHILLS ONSET THIS EVENING .

## 2012-08-02 NOTE — ED Provider Notes (Signed)
History     CSN: [id number]  Arrival date & time 08/02/12  0153   First MD Initiated Contact with Patient 08/02/12 0208      Chief Complaint  Patient presents with  . Emesis  . Diarrhea    (Consider location/radiation/quality/duration/timing/severity/associated sxs/prior treatment) HPI  Patient reports she felt fine all day today until about 9 PM when she had acute onset of nausea and then had multiple episodes of vomiting. She states about 40 minutes later she started having continuous multiple episodes of diarrhea. She states now she is having dry heaves and she only has abdominal pain when she's having the dry heaves. She also states she feels weak and dizzy when she tries to stand. She denies being around any known sick contacts and does not think anything she has made her ill.  PCP Dr Benedetto Coons  Past Medical History  Diagnosis Date  . Hypothyroidism (acquired) 12/2005    history of thyroid cancer and thyroidectomy; Dr. Talmage Nap  . GERD (gastroesophageal reflux disease)   . Osteoporosis   . Internal hemorrhoids 06/30/2002    Dr.Mann    Past Surgical History  Procedure Laterality Date  . Abdominal hysterectomy  1987    still has ovaries.  . Thyroidectomy  12/2005    Family History  Problem Relation Age of Onset  . Dementia Mother   . Cancer Maternal Grandmother 54    breast cancer  . Cancer Paternal Grandmother     pancreatic    History  Substance Use Topics  . Smoking status: Never Smoker   . Smokeless tobacco: Never Used  . Alcohol Use: Yes     Comment: glass or two of wine maybe twice a year.  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    Allergies  Codeine and Prednisone  Home Medications   Current Outpatient Rx  Name  Route  Sig  Dispense  Refill  . dexlansoprazole (DEXILANT) 60 MG  capsule   Oral   Take 60 mg by mouth daily.           Marland Kitchen levothyroxine (SYNTHROID) 175 MCG tablet   Oral   Take 175 mcg by mouth daily.           . promethazine (PHENERGAN) 25 MG tablet   Oral   Take 25 mg by mouth every 6 (six) hours as needed for nausea.           BP 97/51  Pulse 91  Temp(Src) 98.6 F (37 C) (Oral)  Resp 20  SpO2 100%  Vital signs normal except for borderline hypertension   Physical Exam  Nursing note and vitals reviewed. Constitutional: She is oriented to person, place, and time. She appears well-developed and well-nourished.  Non-toxic appearance. She does not appear ill. No distress.  HENT:  Head: Normocephalic and atraumatic.  Right Ear: External ear normal.  Left Ear: External ear normal.  Nose: Nose normal. No mucosal edema or rhinorrhea.  Mouth/Throat: Mucous membranes are normal. No dental abscesses or edematous.  Dry tongue  Eyes: Conjunctivae and EOM are normal. Pupils are equal, round, and reactive to light.  Neck: Normal range of motion and full passive range of motion without pain. Neck supple.  Cardiovascular: Normal rate, regular  rhythm and normal heart sounds.  Exam reveals no gallop and no friction rub.   No murmur heard. Pulmonary/Chest: Effort normal and breath sounds normal. No respiratory distress. She has no wheezes. She has no rhonchi. She has no rales. She exhibits no tenderness and no crepitus.  Abdominal: Soft. Normal appearance and bowel sounds are normal. She exhibits no distension. There is no tenderness. There is no rebound and no guarding.  Musculoskeletal: Normal range of motion. She exhibits no edema and no tenderness.  Moves all extremities well.   Neurological: She is alert and oriented to person, place, and time. She has normal strength. No cranial nerve deficit.  Skin: Skin is warm, dry and intact. No rash noted. No erythema. No pallor.  Psychiatric: Her speech is normal and behavior is normal. Her mood appears  not anxious.  Flat affect    ED Course  Procedures (including critical care time)  Medications  0.9 %  sodium chloride infusion (0 mLs Intravenous Stopped 08/02/12 0317)    Followed by  0.9 %  sodium chloride infusion (0 mLs Intravenous Stopped 08/02/12 0355)    Followed by  0.9 %  sodium chloride infusion (1,000 mLs Intravenous New Bag/Given 08/02/12 0354)  ondansetron (ZOFRAN) injection 4 mg (4 mg Intravenous Given 08/02/12 0312)  sodium chloride 0.9 % bolus 1,000 mL (not administered)  loperamide (IMODIUM) capsule 4 mg (4 mg Oral Given 08/02/12 0239)  sodium chloride 0.9 % bolus 1,000 mL (0 mLs Intravenous Stopped 08/02/12 0613)   04:15 receck, has had 2 liters of NS and zofran x 2. Starting to feel better, no Urine Output yet, will given third liter.   06:08 Has had 3rd liter of NS, states her nausea is gone, is sipping liquids, but no urine output yet, will given 4th liter and she should be able to be discharged.   Results for orders placed during the hospital encounter of 08/02/12  CBC WITH DIFFERENTIAL      Result Value Range   WBC 13.5 (*) 4.0 - 10.5 K/uL   RBC 4.91  3.87 - 5.11 MIL/uL   Hemoglobin 15.2 (*) 12.0 - 15.0 g/dL   HCT 16.1  09.6 - 04.5 %   MCV 85.3  78.0 - 100.0 fL   MCH 31.0  26.0 - 34.0 pg   MCHC 36.3 (*) 30.0 - 36.0 g/dL   RDW 40.9  81.1 - 91.4 %   Platelets 284  150 - 400 K/uL   Neutrophils Relative 91 (*) 43 - 77 %   Neutro Abs 12.3 (*) 1.7 - 7.7 K/uL   Lymphocytes Relative 2 (*) 12 - 46 %   Lymphs Abs 0.3 (*) 0.7 - 4.0 K/uL   Monocytes Relative 6  3 - 12 %   Monocytes Absolute 0.9  0.1 - 1.0 K/uL   Eosinophils Relative 0  0 - 5 %   Eosinophils Absolute 0.0  0.0 - 0.7 K/uL   Basophils Relative 0  0 - 1 %   Basophils Absolute 0.0  0.0 - 0.1 K/uL  BASIC METABOLIC PANEL      Result Value Range   Sodium 139  135 - 145 mEq/L   Potassium 4.4  3.5 - 5.1 mEq/L   Chloride 100  96 - 112 mEq/L   CO2 23  19 - 32 mEq/L   Glucose, Bld 122 (*) 70 - 99 mg/dL   BUN 26  (*) 6 - 23 mg/dL   Creatinine, Ser 7.82  0.50 - 1.10 mg/dL  Calcium 9.6  8.4 - 10.5 mg/dL   GFR calc non Af Amer 89 (*) >90 mL/min   GFR calc Af Amer >90  >90 mL/min   Laboratory interpretation all normal except leukocytosis consistent with vomiting, mild hyperglycemia, minor elevation of BUN consistent with dehydration    1. Nausea vomiting and diarrhea     New Prescriptions   ONDANSETRON (ZOFRAN ODT) 8 MG DISINTEGRATING TABLET    Place 1 on tongue every 6 hrs prn nausea or vomiting   Plan discharge  Devoria Albe, MD, FACEP   MDM          Ward Givens, MD 08/02/12 [phone]

## 2013-03-16 ENCOUNTER — Ambulatory Visit (INDEPENDENT_AMBULATORY_CARE_PROVIDER_SITE_OTHER): Payer: Medicare Other | Admitting: Family Medicine

## 2013-03-16 ENCOUNTER — Encounter: Payer: Self-pay | Admitting: Family Medicine

## 2013-03-16 VITALS — BP 102/68 | HR 68 | Ht 66.0 in | Wt 140.0 lb

## 2013-03-16 DIAGNOSIS — E039 Hypothyroidism, unspecified: Secondary | ICD-10-CM

## 2013-03-16 DIAGNOSIS — Z23 Encounter for immunization: Secondary | ICD-10-CM

## 2013-03-16 DIAGNOSIS — K219 Gastro-esophageal reflux disease without esophagitis: Secondary | ICD-10-CM

## 2013-03-16 MED ORDER — DEXLANSOPRAZOLE 60 MG PO CPDR: 60.0000 mg | DELAYED_RELEASE_CAPSULE | Freq: Every day | ORAL | Status: DC

## 2013-03-16 NOTE — Patient Instructions (Signed)
We will be in touch with your results.  If you sign up for My Chart (your activation code is on this paperwork) you will be able to access your test results through the computer.  If not, we will call you with the results.  We will send copies to Dr. Talmage Nap as well.

## 2013-03-16 NOTE — Progress Notes (Signed)
Chief Complaint  Patient presents with  . Thyroid Problem    takes Synthroid and wonders if it needs to possibly be increased. Is having fatigued and her hair is awful, this is usually a sign for her. Also was rx'd Dexilant from Dr.Mann, wants to know if Dr.Lawton Dollinger can do refills.    Hypothyroidism:  She has been under the care of Dr. Talmage Nap for her thyroid.  She has a h/o thyroid cancer.  She recalls that her dose was lowered from 225 to 175 about a year ago.  She cannot recall the last check, but thinks it was a year ago.  She doesn't feel as energetic as she used to, and her hair has changed in texture, drier, frizzy.  Denies any significant loss or thinning.  She has gained some weight.  She has gained 7 pounds since her last visit here in 07/2011.  GERD:  She has been under the care of Dr. Loreta Ave, and has been taking Dexilant.  She started out taking it daily, but was able to cut back to every other day, sometimes every 3 days.  Denies any recurrent heartburn or dyphagia.  Past Medical History  Diagnosis Date  . Hypothyroidism (acquired) 12/2005    history of thyroid cancer and thyroidectomy; Dr. Talmage Nap  . GERD (gastroesophageal reflux disease)   . Osteoporosis   . Internal hemorrhoids 06/30/2002    Dr.Mann   Past Surgical History  Procedure Laterality Date  . Abdominal hysterectomy  1987    still has ovaries.  . Thyroidectomy  12/2005   History   Social History  . Marital Status: Married    Spouse Name: N/A    Number of Children: 2  . Years of Education: N/A   Occupational History  . works for [employer] (part-time)    Social History Main Topics  . Smoking status: Never Smoker   . Smokeless tobacco: Never Used  . Alcohol Use: Yes     Comment: glass or two of wine maybe twice a year.  . Drug Use: No  . Sexual Activity: Not on file   Other Topics Concern  . Not on file   Social History Narrative   Lives with her husband.  Son in [place], Daughter in [place]. 6  grandkids   Current outpatient prescriptions:dexlansoprazole (DEXILANT) 60 MG capsule, Take 1 capsule (60 mg total) by mouth daily., Disp: 30 capsule, Rfl: 6;  levothyroxine (SYNTHROID) 175 MCG tablet, Take 175 mcg by mouth daily.  , Disp: , Rfl: ;  etodolac (LODINE) 400 MG tablet, Take 400 mg by mouth as needed. , Disp: , Rfl: ;  ondansetron (ZOFRAN ODT) 8 MG disintegrating tablet, Place 1 on tongue every 6 hrs prn nausea or vomiting, Disp: 6 tablet, Rfl: 0 promethazine (PHENERGAN) 25 MG tablet, Take 25 mg by mouth every 6 (six) hours as needed for nausea., Disp: , Rfl:   Allergies  Allergen Reactions  . Codeine Anaphylaxis  . Prednisone     Nausea and vomiting   ROS:  She noticed an itchy spot on her left shoulder, became itchy while sitting in office.  No fevers, chills, URI or allergic symptoms, no cough, shortness of breath, chest pain, nausea, vomiting, bowel changes.  See HPI  PHYSICAL EXAM: BP 102/68  Pulse 68  Ht 5\' 6"  (1.676 m)  Wt 140 lb (63.504 kg)  BMI 22.61 kg/m2 Pleasant female in no distress Neck: no lymphadenopathy, thyromegaly or mass (no thyroid tissue palpable, no masses) Heart: regular rate  and rhythm without murmur Lungs: clear bilaterally Back: no spine or CVA tenderness Abdomen: soft, nontender, no organomegaly or mass Extremities: no edema, normal pulses Skin: no rashes, no suspicious lesions.  SK left upper shoulder, not irritated. Neuro: alert and oriented.  Cranial nerves intact, normal gait  ASSESSMENT/PLAN:  Unspecified hypothyroidism - Plan: TSH  Need for prophylactic vaccination and inoculation against influenza - Plan: Flu vaccine HIGH DOSE PF (Fluzone Tri High dose)  Need for prophylactic vaccination against Streptococcus pneumoniae (pneumococcus) - Plan: Pneumococcal conjugate vaccine 13-valent less than 5yo IM  GERD (gastroesophageal reflux disease) - Plan: dexlansoprazole (DEXILANT) 60 MG capsule  Hypothyroidism, acquired  Discussed  following up with Dr. Talmage Nap yearly--we will send copies of labs. Schedule CPE. Immunizations reviewed--she just turned 67, never had pneumonia vaccine--given today, along with flu shot.

## 2013-03-17 DIAGNOSIS — E039 Hypothyroidism, unspecified: Secondary | ICD-10-CM | POA: Insufficient documentation

## 2013-03-18 ENCOUNTER — Other Ambulatory Visit: Payer: Self-pay | Admitting: *Deleted

## 2013-03-18 MED ORDER — SYNTHROID 150 MCG PO TABS: 150.0000 ug | ORAL_TABLET | Freq: Every day | ORAL | Status: DC

## 2013-03-18 MED ORDER — LEVOTHYROXINE SODIUM 150 MCG PO TABS: 150.0000 ug | ORAL_TABLET | Freq: Every day | ORAL | Status: DC

## 2013-03-25 ENCOUNTER — Encounter: Payer: Self-pay | Admitting: *Deleted

## 2013-06-04 DIAGNOSIS — B029 Zoster without complications: Secondary | ICD-10-CM

## 2013-06-04 HISTORY — DX: Zoster without complications: B02.9

## 2013-06-05 ENCOUNTER — Ambulatory Visit (INDEPENDENT_AMBULATORY_CARE_PROVIDER_SITE_OTHER): Payer: Medicare Other | Admitting: Medical

## 2013-06-05 ENCOUNTER — Encounter: Payer: Self-pay | Admitting: Medical

## 2013-06-05 VITALS — BP 100/60 | HR 74 | Temp 98.1°F | Resp 16 | Wt 145.0 lb

## 2013-06-05 DIAGNOSIS — R51 Headache: Secondary | ICD-10-CM

## 2013-06-05 DIAGNOSIS — H109 Unspecified conjunctivitis: Secondary | ICD-10-CM

## 2013-06-05 MED ORDER — POLYMYXIN B-TRIMETHOPRIM 10000-0.1 UNIT/ML-% OP SOLN: 1.0000 [drp] | OPHTHALMIC | Status: DC

## 2013-06-05 NOTE — Progress Notes (Signed)
Subjective:  Barbara Parker is a 68 y.o. female who presents for headache and eye symptoms.  Was in usual state of health until yesterday morning.  She awoke with headache, pressure over right forehead throbbing.  Few hours later had feeling of needles and pins in forehead, hyper sensitive.  throughout the day moved to the left side.  When she awoke with the headache, felt eyes were aching.   Took tylenol throughout the day.   This morning when she awoke, still has pounding headache, but both eyes were matted shut this morning.  She notes redness of eyes, crusting, watery eyes. Ears with some pressure.  No nasal drainage.   No pink eye contacts.  Denies cough, runny nose, sneezing, sore throat, rash.  Treatment to date: tylenol, nothing else.  No sick contacts.  No other aggravating or relieving factors.  No other c/o.  Review of Systems Constitutional: -fever, -chills, -sweats, -unexpected weight change,-fatigue ENT: -runny nose,  -sore throat Cardiology:  -chest pain, -palpitations, -edema Respiratory: -cough, -shortness of breath, -wheezing Gastroenterology: -abdominal pain, -nausea, -vomiting, -diarrhea, -constipation  Musculoskeletal: -arthralgias, -myalgias, -joint swelling, -back pain Ophthalmology: -vision changes Urology: -dysuria, -difficulty urinating, -hematuria, -urinary frequency, -urgency Neurology:  -weakness, -tingling, -numbness Skin: no rash     Objective: Filed Vitals:   06/05/13 1329  BP: 100/60  Pulse: 74  Temp: 98.1 F (36.7 C)  Resp: 16    General appearance: Alert, WD/WN, no distress                             Skin: warm, no rash, no vesicles or rash of face particularly                           Head: no sinus tenderness                            Eyes: corneas clear, PERRLA, bilateral conjunctiva injected, right worse than left, crusting present of eyelids, slight pus drainage of the right eye, slight swelling of eyelids in general, eyes appear watery in  general, EOMI                            Ears: pearly TMs, external ear canals normal                          Nose: septum midline, turbinates normal, no discharge             Mouth/throat: MMM, tongue normal, no pharyngeal erythema                           Neck: supple, no adenopathy, no thyromegaly, nontender                          Heart: RRR, normal S1, S2, no murmurs                         Lungs: CTA bilaterally, no wheezes, rales, or rhonchi Neuro: CN II through XII intact, normal DTRs, nonfocal exam     Assessment: Encounter Diagnoses  Name Primary?  . Conjunctivitis unspecified Yes  . Headache(784.0)      Plan: We discussed her concerns, findings  on exam.  Begin Polytrim eyedrops, good hygiene, prevention, discussed means of contagion, discussed supportive care. Switch to Aleve or ibuprofen for headache for the next few days.  Rest, hydrate well.  Discussed other causes of headache, hypersensitivity of scalp.  Discussed usual course of treatment of conjunctivitis.  Regarding headache, likely just related to the conjunctivitis. At this point she will use a watch and wait approach. If much worse symptoms over the weekend such as high fever, rash of the face, vision loss, severe headache, consider urgent care or the emergency department.  Call/return in 2-3 days if symptoms aren't resolving otherwise.

## 2013-06-06 ENCOUNTER — Encounter (HOSPITAL_COMMUNITY): Payer: Self-pay | Admitting: Emergency Medicine

## 2013-06-06 ENCOUNTER — Emergency Department (HOSPITAL_COMMUNITY)
Admission: EM | Admit: 2013-06-06 | Discharge: 2013-06-06 | Disposition: A | Discharge status: Home / Self Care | Payer: Medicare Other | Source: Home / Self Care

## 2013-06-06 DIAGNOSIS — B0239 Other herpes zoster eye disease: Principal | ICD-10-CM

## 2013-06-06 DIAGNOSIS — B0230 Zoster ocular disease, unspecified: Secondary | ICD-10-CM

## 2013-06-06 MED ORDER — ACYCLOVIR 400 MG PO TABS
400.0000 mg | ORAL_TABLET | Freq: Four times a day (QID) | ORAL | Status: DC
Start: 2013-06-06 — End: 2013-06-22

## 2013-06-06 MED ORDER — TETRACAINE HCL 0.5 % OP SOLN
OPHTHALMIC | Status: AC
Start: 2013-06-06 — End: 2013-06-06
  Filled 2013-06-06: qty 2

## 2013-06-06 NOTE — ED Provider Notes (Signed)
CSN: [id number]     Arrival date & time 06/06/13  5366 History   None    Chief Complaint  Patient presents with  . Herpes Zoster   (Consider location/radiation/quality/duration/timing/severity/associated sxs/prior Treatment) The history is provided by the patient.   The patient presents today with onset of pain in the form of pins and needles with any sort of palpation on the skin and scalp to the R side of her face.  Patient states the onset of discomfort was Wednesday evening. Patient states she was seen in her primary care provider's office by the PA yesterday. Patient states there was no evidence of a rash at that. The patient was started on polymyxin drops and was advised by the PA to see the urgent care Center today should a rash appear.  Patient presents today with a rash on the right side of her forehead and face and reports extreme discomfort at a 7-8 on a 10 scale.    Past Medical History  Diagnosis Date  . Hypothyroidism (acquired) 12/2005    history of thyroid cancer and thyroidectomy; Dr. Chalmers Cater  . GERD (gastroesophageal reflux disease)   . Osteoporosis   . Internal hemorrhoids 06/30/2002    Dr.Mann   Past Surgical History  Procedure Laterality Date  . Abdominal hysterectomy  1987    still has ovaries.  . Thyroidectomy  12/2005   Family History  Problem Relation Age of Onset  . Dementia Mother   . Cancer Maternal Grandmother 59    breast cancer  . Cancer Paternal Grandmother     pancreatic   History  Substance Use Topics  . Smoking status: Never Smoker   . Smokeless tobacco: Never Used  . Alcohol Use: Yes     Comment: glass or two of wine maybe twice a year.   OB History   Grav Para Term Preterm Abortions TAB SAB Ect Mult Living   2 2 2             Review of Systems  Constitutional: Negative for fever and chills.       She reports mild malaise.  HENT: Negative.   Eyes: Positive for pain and redness. Negative for photophobia and visual disturbance.       The  patient denies visual changes.  Respiratory: Negative.   Cardiovascular: Negative.   Gastrointestinal: Positive for nausea. Negative for vomiting, diarrhea and constipation.  Endocrine: Negative.   Genitourinary: Negative.   Musculoskeletal: Negative.   Skin: Positive for rash.  Allergic/Immunologic: Negative.   Neurological: Positive for dizziness.       The patient has reported some mild dizziness at times but denies any syncope or other neurological symptoms.  Hematological: Negative.   Psychiatric/Behavioral: Negative.     Allergies  Codeine and Prednisone  Home Medications   Current Outpatient Rx  Name  Route  Sig  Dispense  Refill  . acyclovir (ZOVIRAX) 400 MG tablet   Oral   Take 1 tablet (400 mg total) by mouth 4 (four) times daily.   54 tablet   0   . dexlansoprazole (DEXILANT) 60 MG capsule   Oral   Take 1 capsule (60 mg total) by mouth daily.   30 capsule   6   . SYNTHROID 150 MCG tablet   Oral   Take 1 tablet (150 mcg total) by mouth daily before breakfast.   30 tablet   1     Dispense as written.   . trimethoprim-polymyxin b (POLYTRIM) ophthalmic solution  Both Eyes   Place 1 drop into both eyes every 4 (four) hours.   10 mL   0    BP 126/79  Pulse 69  Temp(Src) 98.2 F (36.8 C) (Oral)  Resp 17  SpO2 100%   Physical Exam  Nursing note and vitals reviewed. Constitutional: She is oriented to person, place, and time. She appears well-developed and well-nourished. No distress.  HENT:  Head: Normocephalic and atraumatic.  Eyes: EOM are normal. Pupils are equal, round, and reactive to light. Right conjunctiva is injected.    No AV nicking noted.  + Red reflex.  Cardiovascular: Normal rate, regular rhythm, normal heart sounds and intact distal pulses.  Exam reveals no gallop and no friction rub.   No murmur heard. Pulmonary/Chest: Effort normal and breath sounds normal. No respiratory distress. She has no wheezes. She has no rales. She  exhibits no tenderness.  Neurological: She is alert and oriented to person, place, and time. She displays normal reflexes. No cranial nerve deficit. She exhibits normal muscle tone. Coordination normal.  Skin: Skin is warm and dry. Rash noted. She is not diaphoretic.  Psychiatric: She has a normal mood and affect. Her behavior is normal.    ED Course  Procedures (including critical care time) Labs Review Labs Reviewed - No data to display Imaging Review No results found.  Time spent with patient extended due to difficulty in finding adequate follow up care provider for through eye examination.  Dr. Juventino Slovak consulted with fluorescein stain and eye examination. MDM   1. Herpes zoster of eye    Meds ordered this encounter  Medications  . acyclovir (ZOVIRAX) 400 MG tablet    Sig: Take 1 tablet (400 mg total) by mouth 4 (four) times daily.    Dispense:  54 tablet    Refill:  0    Plan of care discussed with Dr. Juventino Slovak.  The patient to followup today in the office with Dr. [place] really follow visit today.    Jacqualyn Posey, NP 06/06/13 [place], NP 06/06/13 [phone]

## 2013-06-06 NOTE — Discharge Instructions (Signed)
Shingles Shingles (herpes zoster) is an infection that is caused by the same virus that causes chickenpox (varicella). The infection causes a painful skin rash and fluid-filled blisters, which eventually break open, crust over, and heal. It may occur in any area of the body, but it usually affects only one side of the body or face. The pain of shingles usually lasts about 1 month. However, some people with shingles may develop long-term (chronic) pain in the affected area of the body. Shingles often occurs many years after the person had chickenpox. It is more common:  In people older than 50 years.  In people with weakened immune systems, such as those with HIV, AIDS, or cancer.  In people taking medicines that weaken the immune system, such as transplant medicines.  In people under great stress. CAUSES  Shingles is caused by the varicella zoster virus (VZV), which also causes chickenpox. After a person is infected with the virus, it can remain in the person's body for years in an inactive state (dormant). To cause shingles, the virus reactivates and breaks out as an infection in a nerve root. The virus can be spread from person to person (contagious) through contact with open blisters of the shingles rash. It will only spread to people who have not had chickenpox. When these people are exposed to the virus, they may develop chickenpox. They will not develop shingles. Once the blisters scab over, the person is no longer contagious and cannot spread the virus to others. SYMPTOMS  Shingles shows up in stages. The initial symptoms may be pain, itching, and tingling in an area of the skin. This pain is usually described as burning, stabbing, or throbbing.In a few days or weeks, a painful red rash will appear in the area where the pain, itching, and tingling were felt. The rash is usually on one side of the body in a band or belt-like pattern. Then, the rash usually turns into fluid-filled blisters. They  will scab over and dry up in approximately 2 3 weeks. Flu-like symptoms may also occur with the initial symptoms, the rash, or the blisters. These may include:  Fever.  Chills.  Headache.  Upset stomach. DIAGNOSIS  Your caregiver will perform a skin exam to diagnose shingles. Skin scrapings or fluid samples may also be taken from the blisters. This sample will be examined under a microscope or sent to a lab for further testing. TREATMENT  There is no specific cure for shingles. Your caregiver will likely prescribe medicines to help you manage the pain, recover faster, and avoid long-term problems. This may include antiviral drugs, anti-inflammatory drugs, and pain medicines. HOME CARE INSTRUCTIONS   Take a cool bath or apply cool compresses to the area of the rash or blisters as directed. This may help with the pain and itching.   Only take over-the-counter or prescription medicines as directed by your caregiver.   Rest as directed by your caregiver.  Keep your rash and blisters clean with mild soap and cool water or as directed by your caregiver.  Do not pick your blisters or scratch your rash. Apply an anti-itch cream or numbing creams to the affected area as directed by your caregiver.  Keep your shingles rash covered with a loose bandage (dressing).  Avoid skin contact with:  Babies.   Pregnant women.   Children with eczema.   Elderly people with transplants.   People with chronic illnesses, such as leukemia or AIDS.   Wear loose-fitting clothing to help ease   the pain of material rubbing against the rash.  Keep all follow-up appointments with your caregiver.If the area involved is on your face, you may receive a referral for follow-up to a specialist, such as an eye doctor (ophthalmologist) or an ear, nose, and throat (ENT) doctor. Keeping all follow-up appointments will help you avoid eye complications, chronic pain, or disability.  SEEK IMMEDIATE MEDICAL  CARE IF:   You have facial pain, pain around the eye area, or loss of feeling on one side of your face.  You have ear pain or ringing in your ear.  You have loss of taste.  Your pain is not relieved with prescribed medicines.   Your redness or swelling spreads.   You have more pain and swelling.  Your condition is worsening or has changed.   You have a feveror persistent symptoms for more than 2 3 days.  You have a fever and your symptoms suddenly get worse. MAKE SURE YOU:  Understand these instructions.  Will watch your condition.  Will get help right away if you are not doing well or get worse. Document Released: 05/21/2005 Document Revised: 02/13/2012 Document Reviewed: 01/03/2012 [employer] Patient Information 2014 [employer].  

## 2013-06-06 NOTE — ED Notes (Signed)
C/o shingles since Wednesday States she has pin needle pain in her head States was seen at PCP office by PA and was given Polymyxin eye drops on friday Patient has developed rash on hairline/forehead Still has pin needle pain in her head

## 2013-06-06 NOTE — ED Provider Notes (Signed)
Medical screening examination/treatment/procedure(s) were performed by resident physician or non-physician practitioner and as supervising physician I was immediately available for consultation/collaboration.   Pauline Good MD.   Billy Fischer, MD 06/06/13 2021

## 2013-06-08 ENCOUNTER — Encounter: Payer: Self-pay | Admitting: Family Medicine

## 2013-06-08 ENCOUNTER — Ambulatory Visit (INDEPENDENT_AMBULATORY_CARE_PROVIDER_SITE_OTHER): Payer: Medicare Other | Admitting: Family Medicine

## 2013-06-08 VITALS — BP 118/70 | HR 80 | Temp 98.0°F | Ht 66.0 in | Wt 148.0 lb

## 2013-06-08 DIAGNOSIS — B029 Zoster without complications: Secondary | ICD-10-CM | POA: Insufficient documentation

## 2013-06-08 MED ORDER — TRAMADOL HCL 50 MG PO TABS: 50.0000 mg | ORAL_TABLET | Freq: Four times a day (QID) | ORAL | Status: DC | PRN

## 2013-06-08 MED ORDER — ACYCLOVIR 800 MG PO TABS: 800.0000 mg | ORAL_TABLET | Freq: Every day | ORAL | Status: DC

## 2013-06-08 NOTE — Progress Notes (Signed)
Chief Complaint  Patient presents with  . Follow-up    saw [facility] 06/05/13. Saw [facility] 06/06/13 diagnosed with shingles, given acyclovir 400mg  QID and told to follow up with PCP. Was not given anything for pain. Did see her eye doctor Sat and there is a blister on her eye and is following up with her later today @ 3:40pm. (all meds reconciled)   She was seen last week by Audelia Acton with scalp and eye pain, with crusting of eye.  Diagnosed with conjunctivitis (and could not r/o early shingles). The following day she developed a rash, so went to UC and was diagnosed with shingles.  She was put on acyclovir, but not prescribed any pain medication due to her codeine allergy.  She has been in a lot of pain.  Last night she took 4 Aleve and 4 Tylenol, and was able to sleep.  The night before, she couldn't sleep at all.  She is also complaining of itching at the scalp. She is having pain in the right eye, and in forehead, scalp, and to her right ear, and behind her right ear (like someone is sticking an ice pick in it). She has seen the eye doctor once, and is scheduled for f/u this afternoon.    She states she was given prescription from Urgent Care was only for 400mg  of acyclovir, to be taken QID.  Pharmacist told her dose was too low, so she has been taking 800mg  QID.  She will run out before course is completed.  Past Medical History  Diagnosis Date  . Hypothyroidism (acquired) 12/2005    history of thyroid cancer and thyroidectomy; Dr. Chalmers Cater  . GERD (gastroesophageal reflux disease)   . Osteoporosis   . Internal hemorrhoids 06/30/2002    Dr.Mann  . Shingles 06/2013    affecting R eye and scalp   Past Surgical History  Procedure Laterality Date  . Abdominal hysterectomy  1987    still has ovaries.  . Thyroidectomy  12/2005   History   Social History  . Marital Status: Married    Spouse Name: N/A    Number of Children: 2  . Years of Education: N/A   Occupational History  . works for  [employer] (part-time)    Social History Main Topics  . Smoking status: Never Smoker   . Smokeless tobacco: Never Used  . Alcohol Use: Yes     Comment: glass or two of wine maybe twice a year.  . Drug Use: No  . Sexual Activity: Not on file   Other Topics Concern  . Not on file   Social History Narrative   Lives with her husband.  Son in [place], Daughter in [place]. 6 grandkids   Current outpatient prescriptions:acyclovir (ZOVIRAX) 400 MG tablet, Take 1 tablet (400 mg total) by mouth 4 (four) times daily., Disp: 54 tablet, Rfl: 0;  dexlansoprazole (DEXILANT) 60 MG capsule, Take 1 capsule (60 mg total) by mouth daily., Disp: 30 capsule, Rfl: 6;  SYNTHROID 150 MCG tablet, Take 1 tablet (150 mcg total) by mouth daily before breakfast., Disp: 30 tablet, Rfl: 1 acyclovir (ZOVIRAX) 800 MG tablet, Take 1 tablet (800 mg total) by mouth 5 (five) times daily. take to complete 7 day course of 800mg  5x/d (after original 400mg  prescription), Disp: 10 tablet, Rfl: 0;  traMADol (ULTRAM) 50 MG tablet, Take 1-2 tablets (50-100 mg total) by mouth every 6 (six) hours as needed for severe pain., Disp: 30 tablet, Rfl: 0  Allergies  Allergen Reactions  . Codeine Anaphylaxis  . Prednisone     Nausea and vomiting    ROS:  Denies fevers, chills, dizziness, chest pain, GI complaints.  No chest pain, URI symptoms. +ongoing crusting of right eye, and pain and itching at scalp and forehead.  PHYSICAL EXAM: BP 118/70  Pulse 80  Temp(Src) 98 F (36.7 C) (Oral)  Ht 5\' 6"  (1.676 m)  Wt 148 lb (67.132 kg)  BMI 23.90 kg/m2 Well developed, pleasant female in mild discomfort. HEENT:  Diffuse area of small (<7mm) blisters on red base extending from her right eye (which is swollen), up to forehead and into parietal scalp. No crusting or scabbing.  Very sensitive to the touch in this area.  ASSESSMENT/PLAN:  Shingles - treatment was started, but in need of pain control. Codeine allergic. use tramadol prn. f/u  with ophtho as scheduled - Plan: acyclovir (ZOVIRAX) 800 MG tablet, traMADol (ULTRAM) 50 MG tablet  Reviewed recommended prescription treatment doses for shingles.  Using acyclovir, should be 800mg  5x/day for 7 days.  She been taking 800mg  QID thus far.  Vs Valtrex 1000mg  TID.  She has many pills left of the acyclovir, so she will now change to 800mg  (2 tablets) FIVE times daily to complete a 7 day course of treatment.  Use up what you have, then change to the new prescription of 800mg  tablets, (and take just ONE pill 5x/day, to complete the 7 day course)  You can use an OTC hydrocortisone cream (ie cortaid) as needed for itching.  Use the tramadol for pain.  Call if/when you need a refill.  Discussed infectious nature, and who she should avoid exposure to.  All questions answered (except deferred ophtho question to ophtho)  Risks/side effects of meds reviewed.  Expected course of illness reviewed in detail.  Call if/when tramadol refills are needed. Hopefully her prior zostavax will protect her from a PHN

## 2013-06-08 NOTE — Patient Instructions (Signed)
Complete the prescription you have at home by taking 800mg  (2 tablets) FIVE times daily, and then fill the 800mg  tablet in order to complete a 7 day course of treatment. (Use up what you have, then change to the new prescription of 800mg  tablets, (and take just ONE pill 5x/day, to complete the 7 day course))  You can use an OTC hydrocortisone cream (ie cortaid) as needed for itching.  Use the tramadol for pain.  Call if/when you need a refill.  Shingles Shingles (herpes zoster) is an infection that is caused by the same virus that causes chickenpox (varicella). The infection causes a painful skin rash and fluid-filled blisters, which eventually break open, crust over, and heal. It may occur in any area of the body, but it usually affects only one side of the body or face. The pain of shingles usually lasts about 1 month. However, some people with shingles may develop long-term (chronic) pain in the affected area of the body. Shingles often occurs many years after the person had chickenpox. It is more common:  In people older than 50 years.  In people with weakened immune systems, such as those with HIV, AIDS, or cancer.  In people taking medicines that weaken the immune system, such as transplant medicines.  In people under great stress. CAUSES  Shingles is caused by the varicella zoster virus (VZV), which also causes chickenpox. After a person is infected with the virus, it can remain in the person's body for years in an inactive state (dormant). To cause shingles, the virus reactivates and breaks out as an infection in a nerve root. The virus can be spread from person to person (contagious) through contact with open blisters of the shingles rash. It will only spread to people who have not had chickenpox. When these people are exposed to the virus, they may develop chickenpox. They will not develop shingles. Once the blisters scab over, the person is no longer contagious and cannot spread the  virus to others. SYMPTOMS  Shingles shows up in stages. The initial symptoms may be pain, itching, and tingling in an area of the skin. This pain is usually described as burning, stabbing, or throbbing.In a few days or weeks, a painful red rash will appear in the area where the pain, itching, and tingling were felt. The rash is usually on one side of the body in a band or belt-like pattern. Then, the rash usually turns into fluid-filled blisters. They will scab over and dry up in approximately 2 3 weeks. Flu-like symptoms may also occur with the initial symptoms, the rash, or the blisters. These may include:  Fever.  Chills.  Headache.  Upset stomach. DIAGNOSIS  Your caregiver will perform a skin exam to diagnose shingles. Skin scrapings or fluid samples may also be taken from the blisters. This sample will be examined under a microscope or sent to a lab for further testing. TREATMENT  There is no specific cure for shingles. Your caregiver will likely prescribe medicines to help you manage the pain, recover faster, and avoid long-term problems. This may include antiviral drugs, anti-inflammatory drugs, and pain medicines. HOME CARE INSTRUCTIONS   Take a cool bath or apply cool compresses to the area of the rash or blisters as directed. This may help with the pain and itching.   Only take over-the-counter or prescription medicines as directed by your caregiver.   Rest as directed by your caregiver.  Keep your rash and blisters clean with mild soap and cool  water or as directed by your caregiver.  Do not pick your blisters or scratch your rash. Apply an anti-itch cream or numbing creams to the affected area as directed by your caregiver.  Keep your shingles rash covered with a loose bandage (dressing).  Avoid skin contact with:  Babies.   Pregnant women.   Children with eczema.   Elderly people with transplants.   People with chronic illnesses, such as leukemia or AIDS.    Wear loose-fitting clothing to help ease the pain of material rubbing against the rash.  Keep all follow-up appointments with your caregiver.If the area involved is on your face, you may receive a referral for follow-up to a specialist, such as an eye doctor (ophthalmologist) or an ear, nose, and throat (ENT) doctor. Keeping all follow-up appointments will help you avoid eye complications, chronic pain, or disability.  SEEK IMMEDIATE MEDICAL CARE IF:   You have facial pain, pain around the eye area, or loss of feeling on one side of your face.  You have ear pain or ringing in your ear.  You have loss of taste.  Your pain is not relieved with prescribed medicines.   Your redness or swelling spreads.   You have more pain and swelling.  Your condition is worsening or has changed.   You have a feveror persistent symptoms for more than 2 3 days.  You have a fever and your symptoms suddenly get worse. MAKE SURE YOU:  Understand these instructions.  Will watch your condition.  Will get help right away if you are not doing well or get worse. Document Released: 05/21/2005 Document Revised: 02/13/2012 Document Reviewed: 01/03/2012 [facility] Patient Information 2014 [place].

## 2013-06-09 ENCOUNTER — Telehealth: Payer: Self-pay | Admitting: Internal Medicine

## 2013-06-09 NOTE — Telephone Encounter (Signed)
Pt states that yesterday evening she has been having her right knee buckling up on her and she wants to know,does that anything to do with having shingles on her right side and something affecting the nerve

## 2013-06-09 NOTE — Telephone Encounter (Signed)
Advise pt--this should not have anything to do with shingles itself (which stays in the nerve affecting the face, superficially, not in the brain).  If she has ongoing problems, she should be evaluated, especially if having weakness in the leg.  I hope that the tramadol is effective in treating her pain

## 2013-06-09 NOTE — Telephone Encounter (Signed)
Patient is aware. CLS 

## 2013-06-22 ENCOUNTER — Encounter: Payer: Self-pay | Admitting: Family Medicine

## 2013-06-22 ENCOUNTER — Ambulatory Visit (INDEPENDENT_AMBULATORY_CARE_PROVIDER_SITE_OTHER): Payer: Medicare Other | Admitting: Family Medicine

## 2013-06-22 VITALS — BP 110/70 | HR 84 | Temp 97.8°F | Ht 66.0 in | Wt 146.0 lb

## 2013-06-22 DIAGNOSIS — B0229 Other postherpetic nervous system involvement: Secondary | ICD-10-CM

## 2013-06-22 NOTE — Patient Instructions (Signed)
Postherpetic Neuralgia Shingles is a painful disease. It is caused by the herpes zoster virus. This is the same virus which also causes chickenpox. It can affect the torso, limbs, or the face. For most people, shingles is a condition of rather sudden onset. Pain usually lasts about 1 month. In older patients, or patients with poor immune systems, a painful, long-standing (chronic) condition called postherpetic neuralgia can develop. This condition rarely happens before age 18. But at least 50% of people over 50 become affected following an attack of shingles. There is a natural tendency for this condition to improve over time with no treatment. Less than 5% of patients have pain that lasts for more than 1 year. DIAGNOSIS  Herpes is usually easily diagnosed on physical exam. Pain sometimes follows when the skin sores (lesions) have disappeared. It is called postherpetic neuralgia. That name simply means the pain that follows herpes. TREATMENT   Treating this condition may be difficult. Usually one of the tricyclic antidepressants, often amitriptyline, is the first line of treatment. There is evidence that the sooner these medications are given, the more likely they are to reduce pain.  Conventional analgesics, regional nerve blocks, and anticonvulsants have little benefit in most cases when used alone. Other tricyclic anti-depressants are used as a second option if the first antidepressant is unsuccessful.  Anticonvulsants, including carbamazepine, have been found to provide some added benefit when used with a tricyclic anti-depressant. This is especially for the stabbing type of pain similar to that of trigeminal neuralgia.  Chronic opioid therapy. This is a strong narcotic pain medication. It is used to treat pain that is resistant to other measures. The issues of dependency and tolerance can be reduced with closely managed care.  Some cream treatments are applied locally to the affected area. They  can help when used with other treatments. Their use may be difficult in the case of postherpetic trigeminal neuralgia. This is involved with the face. So the substances can irritate the eye and the skin around the eye. Examples of creams used include Capsaicin and lidocaine creams.  For shingles, antiviral therapies along with analgesics are recommended. Studies of the effect of anti-viral agents such as acyclovir on shingles have been done. They show improved rates of healing and decreased severity of sudden (acute) pain. Some observations suggest that nerve blocks during shingles infection will:  Reduce pain.  Shorten the acute episode.  Prevent the emergence of postherpetic neuralgia. Viral medications used include Acyclovir (Zovirax), Valacyclovir, Famciclovir and a lysine diet. Document Released: 08/11/2002 Document Revised: 08/13/2011 Document Reviewed: 05/21/2005 [facility] Patient Information 2014 [place], [place].   Start at 50mg  three times daily vs 75mg  twice daily (total dose of 150mg /day).  After a week, if tolerating, increase the evening dose by 50-75mg .  If tolerating the medication, but not having significant pain relief, then further titrate up to 150mg  twice daily.  Call when you need a prescription, and let us know what dose is working for you.

## 2013-06-22 NOTE — Progress Notes (Signed)
Chief Complaint  Patient presents with  . Herpes Zoster    would like a recheck on her right eye. Is in extreme pain. This morning she broke out into a cold sweat, unsure if it is related or not, also has a bad HA.    She has been dealing with shingles, which has been affecting her right eye.  She is seeing her eye doctor about every other day, dealing with an infection, and on various drops.  The skin lesions on the forehead healed up by the end of last week, but the redness around her right eye hasn't gotten better.  Her eyes always feel like something is in it, feels like the skin is going to crack at the lateral corner of her right eye, sore to touch.  Eye still feels itchy, and cortisone cream doesn't help.  She continues to complain of itching and pain.  She is using tramadol only when pain gets very severe, as it makes her sleepy.  She gets drowsy even with 1 tablet. She took tylenol last night, and nothing yet today.  The tramadol, although sedating, is effective in treating her severe pain.  Twice this morning she broke out into a cold sweat, needed to put cold compresses on her forehead and neck.  Thought she might faint.  Her right eye was very painful during these times, all day today.  Today she has a pounding headache in her whole head.  She continues to also still have R sided headaches (from the shingles).  She is asking for cream for her face-- very dry and flaky around her right eyelid, skin feels very rough.    Past Medical History  Diagnosis Date  . Hypothyroidism (acquired) 12/2005    history of thyroid cancer and thyroidectomy; Dr. Chalmers Cater  . GERD (gastroesophageal reflux disease)   . Osteoporosis   . Internal hemorrhoids 06/30/2002    Dr.Mann  . Shingles 06/2013    affecting R eye and scalp   Past Surgical History  Procedure Laterality Date  . Abdominal hysterectomy  1987    still has ovaries.  . Thyroidectomy  12/2005   History   Social History  . Marital Status:  Married    Spouse Name: N/A    Number of Children: 2  . Years of Education: N/A   Occupational History  . works for [employer], prison and probation services (part-time)    Social History Main Topics  . Smoking status: Never Smoker   . Smokeless tobacco: Never Used  . Alcohol Use: Yes     Comment: glass or two of wine maybe twice a year.  . Drug Use: No  . Sexual Activity: Not on file   Other Topics Concern  . Not on file   Social History Narrative   Lives with her husband.  Son in [place], Daughter in [place]. 6 grandkids    Current outpatient prescriptions:dexlansoprazole (DEXILANT) 60 MG capsule, Take 1 capsule (60 mg total) by mouth daily., Disp: 30 capsule, Rfl: 6;  SYNTHROID 150 MCG tablet, Take 1 tablet (150 mcg total) by mouth daily before breakfast., Disp: 30 tablet, Rfl: 1;  traMADol (ULTRAM) 50 MG tablet, Take 1-2 tablets (50-100 mg total) by mouth every 6 (six) hours as needed for severe pain., Disp: 30 tablet, Rfl: 0 acyclovir (ZOVIRAX) 800 MG tablet, Take 1 tablet (800 mg total) by mouth 5 (five) times daily. take to complete 7 day course of 800mg  5x/d (after original 400mg  prescription), Disp: 10 tablet, Rfl: 0 She is  also taking 2 different eye drops from her ophtho  Allergies  Allergen Reactions  . Codeine Anaphylaxis  . Prednisone     Nausea and vomiting   ROS:  Denies fevers, chills, chest pain.  Currently with +HA, but no dizziness.  No URI symptoms.  +right ear pain (since diagnosed with shingles, along with the eye pain).  No nausea, vomiting, diarrhea.  Skin rash only on right eyelid, no other rashes  PHYSICAL EXAM: BP 110/70  Pulse 84  Temp(Src) 97.8 F (36.6 C) (Oral)  Ht 5\' 6"  (1.676 m)  Wt 146 lb (66.225 kg)  BMI 23.58 kg/m2  Well developed female, in no acute distress Right upper eyelid is somewhat dry/flaky.  Minimal erythema noted diffusely around the right eye, no significant swelling.  No crusting, warmth. Slight crusting laterally only at the angle of the eye. No  fissure or erythema.  Neck: no lymphadenopathy Skin: intact without any lesions.  Minimal erythema between eyebrows, and around R eye as mentioned above.  ASSESSMENT/PLAN:  Postherpetic neuralgia  lyrica samples #21 of 50mg  and #28 of 75mg  Start at 50 TID or 75 BID, then increase to 75/150, and then titrate up to 150 BID if tolerated.  Just given samples.  Will call for prescription when needed (to let us know what dose she is tolerating)  Also given written rx and card for a free week of med  Risks/side effects reviewed in detail.  If intolerant of Lyrica (or if rx is unaffordable), then change to gabapentin.  Continue to use tramadol as needed for severe pain. Discussed reasons that we can't use topicals for shingles pain in the affected area (unable to apply patch (lidoderm) to her eye, capsaicin could get in eye and sting) Can use ointment to eyelid (ie vaseline) and further discuss with eye doctor.

## 2013-06-29 ENCOUNTER — Telehealth: Payer: Self-pay | Admitting: Internal Medicine

## 2013-06-29 DIAGNOSIS — B029 Zoster without complications: Secondary | ICD-10-CM

## 2013-06-29 MED ORDER — TRAMADOL HCL 50 MG PO TABS: 50.0000 mg | ORAL_TABLET | Freq: Four times a day (QID) | ORAL | Status: DC | PRN

## 2013-06-29 NOTE — Telephone Encounter (Signed)
Pt will try 75mg  tonight, 125mg  tomorrow night since she can't cut them cause they are capsules and then 150mg  if able Wednesday night and will try to get 75mg  in the am. She will see how she does this week and give [place] a call later on this week if she needs a refill on her med

## 2013-06-29 NOTE — Telephone Encounter (Signed)
Pt called stating that the lyrica med she is on, she is having tingling over face and having pain and severe itchy. She has taken tramadol the last 2 nights for pain and it has not helped any. Please send a new med to [facility]

## 2013-06-29 NOTE — Telephone Encounter (Signed)
Have her try increasing the PM dose (taking it near bedtime, if it makes her sleepy, that's ok because is in the evening; higher dose overall might help with pain more).  She can experiment this week that she isn't working, trying to get am dose up to 75mg , and gradually increasing PM dose first to 100mg  (taking just small dose increments to minimize side effects), then increasing to 150mg  at bedtime if able.

## 2013-06-29 NOTE — Telephone Encounter (Signed)
The pain and itching I believe are her symptoms from her shingles (post-herpetic neuralgia), I don't believe she is stating that she is having a reaction to the Lyrica, just that it isn't working . Please confirm this.  Also, see what dose she is taking  These were the instructions from her last visit:  lyrica samples #21 of 50mg  and #28 of 75mg   Start at 50 TID or 75 BID, then increase to 75/150, and then titrate up to 150 BID if tolerated. Just given samples. Will call for prescription when needed (to let us know what dose she is tolerating)  Also given written rx and card for a free week of med  If she is tolerating the medication make sure she is titrating up to the 150mg  BID, and okay for rx for the 150mg  (BID #60 with 2 refills) if she needs it (only if tolerating 150mg  from trying the samples that way). It takes time for this medication to work, but titrating up on the dose should make it more effective. Continue to use Tramadol for the pain until it becomes effective.  It will take just as long for a new medication (ie neurontin) to become effective, so I really don't want to change unless she is having significant side effects that limit her ability to take it or increase the dose.  She has severe allergy to codeine, so I'd prefer not to give other narcotic pain medications.  Okay to refill the Ultram for #60 if she needs, and she can take 1-2 three times daily, if needed for pain

## 2013-06-29 NOTE — Telephone Encounter (Signed)
Gave Doctor's recommendation..Pt states that she doesn't feel its allergic reaction, she is taking 50mg  TID when she is at home. 50mg  does make her nausea. If she has to go to work or drive she will take the 50mg  BID. However she took a 75mg  tablet today and it made her go to sleep and she slept for like 30 mins. She said when she is at home she can do 75mg  twice daily. 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 She will continue the tramadol which i will call into [facility].

## 2013-06-29 NOTE — Telephone Encounter (Signed)
Called in med 

## 2013-07-13 ENCOUNTER — Telehealth: Payer: Self-pay | Admitting: Family Medicine

## 2013-07-13 DIAGNOSIS — B0229 Other postherpetic nervous system involvement: Secondary | ICD-10-CM

## 2013-07-13 MED ORDER — PREGABALIN 75 MG PO CAPS: ORAL_CAPSULE | ORAL | Status: DC

## 2013-07-13 NOTE — Telephone Encounter (Signed)
Spoke with patient and she is already taking 75mg of Lyrica in the am and 150mg at bedtime. She has tried benadryl and it is not helping. She is going to go out tonight and get some zyrtec and start taking that. Any other recommendations?  If calling tomorrow 07/14/13 during the day please use her cell number [phone].  

## 2013-07-13 NOTE — Telephone Encounter (Signed)
Please call patient lyrica samples worked, needs rx for the pain but she is itching to death, very, very bad itching. Tried numerouse otc creams for itching.  . Is the lyrica what she still needs to take    [facility]

## 2013-07-13 NOTE — Telephone Encounter (Signed)
Spoke with patient and she is already taking 75mg  of Lyrica in the am and 150mg  at bedtime. She has tried benadryl and it is not helping. She is going to go out tonight and get some zyrtec and start taking that. Any other recommendations?  If calling tomorrow 07/14/13 during the day please use her cell number [phone].

## 2013-07-13 NOTE — Telephone Encounter (Signed)
Please call pt and find out the dose of lyrica (how she was using the samples--at least initially I know that she was limited by sedation).  If not sedated by it at all, we can try titrating up to the 150mg  BID dose to see if that helps with the itching also.  If too sedating, maybe she can take 75mg  during the day, and 150 at bedtime?  I'm not sure what she already tried.  She can use zyrtec once daily for the itching, and benadryl prn (but will be sedating)

## 2013-07-13 NOTE — Telephone Encounter (Signed)
Spoke with pt--she is no longer having any pain, but still having a lot of itching.  She did not discuss it with her eye doctor for recommendations for her eyelid itching (she has expensive drops that help temporarily with eye itching).  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  Advised that insurance may have a quantity limit, so we might need to decrease quantity (and change to 150 dose.  ? If her itching may be a nerve pain equivalent, so trying to increase dose is a reasonable recommendation (if tolerable)  rx phoned in, and spoke with pharmacy re: quantity

## 2013-07-27 ENCOUNTER — Ambulatory Visit (INDEPENDENT_AMBULATORY_CARE_PROVIDER_SITE_OTHER): Payer: Medicare Other | Admitting: Family Medicine

## 2013-07-27 ENCOUNTER — Encounter: Payer: Self-pay | Admitting: Family Medicine

## 2013-07-27 VITALS — BP 100/60 | HR 72 | Ht 66.0 in | Wt 146.0 lb

## 2013-07-27 DIAGNOSIS — Z0000 Encounter for general adult medical examination without abnormal findings: Secondary | ICD-10-CM

## 2013-07-27 DIAGNOSIS — E039 Hypothyroidism, unspecified: Secondary | ICD-10-CM

## 2013-07-27 DIAGNOSIS — E7800 Pure hypercholesterolemia, unspecified: Secondary | ICD-10-CM

## 2013-07-27 DIAGNOSIS — M810 Age-related osteoporosis without current pathological fracture: Secondary | ICD-10-CM

## 2013-07-27 DIAGNOSIS — B0229 Other postherpetic nervous system involvement: Secondary | ICD-10-CM

## 2013-07-27 DIAGNOSIS — Z79899 Other long term (current) drug therapy: Secondary | ICD-10-CM

## 2013-07-27 DIAGNOSIS — K219 Gastro-esophageal reflux disease without esophagitis: Secondary | ICD-10-CM

## 2013-07-27 LAB — COMPREHENSIVE METABOLIC PANEL
ALT: 34 U/L (ref 0–35)
AST: 29 U/L (ref 0–37)
Albumin: 4 g/dL (ref 3.5–5.2)
Alkaline Phosphatase: 118 U/L — ABNORMAL HIGH (ref 39–117)
BILIRUBIN TOTAL: 0.7 mg/dL (ref 0.2–1.2)
BUN: 19 mg/dL (ref 6–23)
CALCIUM: 8.9 mg/dL (ref 8.4–10.5)
CHLORIDE: 103 meq/L (ref 96–112)
CO2: 29 meq/L (ref 19–32)
Creat: 0.58 mg/dL (ref 0.50–1.10)
GLUCOSE: 80 mg/dL (ref 70–99)
Potassium: 4.6 mEq/L (ref 3.5–5.3)
SODIUM: 141 meq/L (ref 135–145)
TOTAL PROTEIN: 7.1 g/dL (ref 6.0–8.3)

## 2013-07-27 LAB — CBC WITH DIFFERENTIAL/PLATELET
BASOS ABS: 0 10*3/uL (ref 0.0–0.1)
Basophils Relative: 0 % (ref 0–1)
EOS PCT: 1 % (ref 0–5)
Eosinophils Absolute: 0.1 10*3/uL (ref 0.0–0.7)
HCT: 36.9 % (ref 36.0–46.0)
Hemoglobin: 12.7 g/dL (ref 12.0–15.0)
LYMPHS ABS: 1.2 10*3/uL (ref 0.7–4.0)
LYMPHS PCT: 15 % (ref 12–46)
MCH: 29.9 pg (ref 26.0–34.0)
MCHC: 34.4 g/dL (ref 30.0–36.0)
MCV: 86.8 fL (ref 78.0–100.0)
Monocytes Absolute: 0.8 10*3/uL (ref 0.1–1.0)
Monocytes Relative: 10 % (ref 3–12)
NEUTROS ABS: 5.9 10*3/uL (ref 1.7–7.7)
NEUTROS PCT: 74 % (ref 43–77)
PLATELETS: 322 10*3/uL (ref 150–400)
RBC: 4.25 MIL/uL (ref 3.87–5.11)
RDW: 14.7 % (ref 11.5–15.5)
WBC: 8 10*3/uL (ref 4.0–10.5)

## 2013-07-27 LAB — LIPID PANEL
Cholesterol: 210 mg/dL — ABNORMAL HIGH (ref 0–200)
HDL: 81 mg/dL (ref >39)
LDL Cholesterol: 114 mg/dL — ABNORMAL HIGH (ref 0–99)
TRIGLYCERIDES: 76 mg/dL (ref <150)
Total CHOL/HDL Ratio: 2.6 Ratio
VLDL: 15 mg/dL (ref 0–40)

## 2013-07-27 LAB — POCT URINALYSIS DIPSTICK
BILIRUBIN UA: NEGATIVE
Blood, UA: NEGATIVE
GLUCOSE UA: NEGATIVE
Ketones, UA: NEGATIVE
LEUKOCYTES UA: NEGATIVE
NITRITE UA: NEGATIVE
Protein, UA: NEGATIVE
Spec Grav, UA: 1.015
Urobilinogen, UA: NEGATIVE
pH, UA: 6

## 2013-07-27 NOTE — Progress Notes (Signed)
Chief Complaint  Patient presents with  . Annual Exam    fasting annual physical-UA was normal but dos have some urgency issues over the last 6 months. Did not do eye exam as she was just seen by Dr.Oman. Would like to go ff of Lyrica, makes her feel badly.    Barbara Parker is a 68 y.o. female who presents for a complete physical.  She has the following concerns:  Shingles/Postherpetic neuralgia: The itching is greatly diminished since being on the Lyrica.  Sometimes she gets some electrical shocks across the right eyebrow and down the nose, infrequent.  Her forehead still feels sore from where she scratched it from when it was so itchy. She is taking 72m in the morning, one mid-day, and 2 at bedtime.  She has had headache, and feels like her hands and feet are somewhat swollen since she has been on the medication.  GERD:  She takes Dexilant about every other day.  If she goes 3 days without the medication, she will get recurrent symptoms when lying down at night.  Denies dysphagia (very rare--it used to occur frequently, having some choking spells with eating).  Osteoporosis:  She had DEXA 04/2011 which showed osteoporosis.  She came for a visit 07/2011 to discuss the results and medications.  She was given samples of Actonel weekly.  She recalls taking the samples, but doesn't recall if there were any problems.  She never followed up or called for prescription.  Hypothyroidism:  Dose was decreased to 150 mcg of Synthroid mid-October.  She has follow-up scheduled with Dr. BChalmers Caterfor re-check.  She has some constipation.  Denies hair or skin changes (slightly dry skin)  Immunization History  Administered Date(s) Administered  . Influenza Split 04/09/2011, 03/25/2012  . Influenza, High Dose Seasonal PF 03/16/2013  . Pneumococcal Conjugate-13 03/16/2013  . Tdap 04/09/2011  . Zoster 05/15/2011   Last Pap smear: 2004  Last mammogram: 04/2012 Last colonoscopy: 04/2012  Dr. MCollene Mares [facility]  [facility]. Dentist: twice yearly  Ophtho: yearly, with very frequent visits recently due to shingles involving eye Exercise: she has been working a lot, not getting to the gym as often.  Does zumba and weight machines, going just once a week (previously went 3x/week and her goal is to get back to that). Normal vitamin D-OH in 04/2011 (41)--can't recall if she was taking vitamins at that time or not. Last lipid panel 04/2011 Lab Results  Component Value Date   CHOL 204* 04/09/2011   HDL 87 04/09/2011   LDLCALC 106* 04/09/2011   TRIG 53 04/09/2011   CHOLHDL 2.3 04/09/2011    Past Medical History  Diagnosis Date  . Hypothyroidism (acquired) 12/2005    history of thyroid cancer and thyroidectomy; Dr. BChalmers Cater . GERD (gastroesophageal reflux disease)   . Osteoporosis   . Internal hemorrhoids 06/30/2002    Dr.Mann  . Shingles 06/2013    affecting R eye and scalp    Past Surgical History  Procedure Laterality Date  . Abdominal hysterectomy  1987    still has ovaries. (benign reasons)  . Thyroidectomy  12/2005    History   Social History  . Marital Status: Married    Spouse Name: N/A    Number of Children: 2  . Years of Education: N/A   Occupational History  . works for [employer], prison and probation services(part-time)    Social History Main Topics  . Smoking status: Never Smoker   . Smokeless  tobacco: Never Used  . Alcohol Use: Yes     Comment: glass or two of wine maybe twice a year.  . Drug Use: No  . Sexual Activity: Not on file   Other Topics Concern  . Not on file   Social History Narrative   Lives with her husband.  Son in [place], Daughter in [place]. 6 grandkids    Family History  Problem Relation Age of Onset  . Dementia Mother   . Cancer Maternal Grandmother 68    breast cancer  . Breast cancer Maternal Grandmother   . Cancer Paternal Grandmother     pancreatic  . Hypothyroidism Daughter   . Colon cancer Neg Hx     Current outpatient  prescriptions:dexlansoprazole (DEXILANT) 60 MG capsule, Take 1 capsule (60 mg total) by mouth daily., Disp: 30 capsule, Rfl: 6;  pregabalin (LYRICA) 75 MG capsule, 1 tab in a.m., 2 qHS.  Titrate up to 2 caps BID, as tolerated, Disp: 120 capsule, Rfl: 1;  SYNTHROID 150 MCG tablet, Take 1 tablet (150 mcg total) by mouth daily before breakfast., Disp: 30 tablet, Rfl: 1  Allergies  Allergen Reactions  . Codeine Anaphylaxis  . Prednisone     Nausea and vomiting    ROS: The patient denies anorexia, fever, weight changes, vision changes, decreased hearing, ear pain, sore throat, breast concerns, chest pain, palpitations, dizziness, syncope, dyspnea on exertion, cough, swelling, nausea, vomiting, diarrhea, constipation, abdominal pain, melena, hematochezia, indigestion/heartburn, hematuria, incontinence, dysuria, vaginal bleeding, discharge, odor or itch, genital lesions, joint pains, numbness, tingling, weakness, tremor, suspicious skin lesions, depression, anxiety, abnormal bleeding/bruising, or enlarged lymph nodes.  +rare tingling in feet or hands, depending on position  Takes Dexilant every other day without any recurrence of cough or heartburn.  No dysphagia. Some chronic constipation. Chronically bruises easily, unchanged. Headaches and very short-lived dizziness from lyrica Mild urinary urgency, no incontinence.  PHYSICAL EXAM:  BP 100/60  Pulse 72  Ht _0  (1.676 m)  Wt 146 lb (66.225 kg)  BMI 23.58 kg/m2  General Appearance:  Alert, cooperative, no distress, appears stated age   Head:  Normocephalic, without obvious abnormality, atraumatic   Eyes:  PERRL, conjunctiva/corneas clear, EOM's intact, fundi benign.  No longer has any rash, swelling  Ears:  Normal TM's and external ear canals   Nose:  Nares normal, mucosa normal, no drainage or sinus tenderness   Throat:  Lips, mucosa, and tongue normal; teeth and gums normal   Neck:  Supple, no lymphadenopathy; thyroid: no  enlargement/tenderness/nodules; no carotid  bruit or JVD   Back:  Spine nontender, no curvature, ROM normal, no CVA tenderness   Lungs:  Clear to auscultation bilaterally without wheezes, rales or ronchi; respirations unlabored   Chest Wall:  No tenderness or deformity   Heart:  Regular rate and rhythm, S1 and S2 normal, no murmur, rub  or gallop   Breast Exam:  No tenderness, masses, or nipple discharge or inversion. No axillary lymphadenopathy   Abdomen:  Soft, non-tender, nondistended, normoactive bowel sounds,  no masses, no hepatosplenomegaly   Genitalia:  Normal external genitalia without lesions. BUS and vagina normal; Uterus absent; and adnexa not enlarged, not palpable, nontender, no masses. Pap not performed   Rectal:  Normal tone, no masses or tenderness; guaiac negative stool   Extremities:  No clubbing, cyanosis or edema   Pulses:  2+ and symmetric all extremities   Skin:  Skin color, texture, turgor normal, no rashes or lesions   Lymph nodes:  Cervical, supraclavicular, and axillary nodes normal   Neurologic:  CNII-XII intact, normal strength, sensation and gait; reflexes 2+ and symmetric throughout          Psych: Normal mood, affect, hygiene and grooming.   ASSESSMENT/PLAN:  Routine general medical examination at a health care facility - Plan: Lipid panel, Comprehensive metabolic panel, CBC with Differential  Osteoporosis - Plan: Vit D  25 hydroxy (rtn osteoporosis monitoring)  Encounter for long-term (current) use of other medications - Plan: Comprehensive metabolic panel, CBC with Differential  Postherpetic neuralgia - improvedo on Lyrica  Pure hypercholesterolemia - Plan: Lipid panel  GERD (gastroesophageal reflux disease)  Hypothyroidism, acquired - f/u as scheduled with Dr. Chalmers Cater for f/u TSH since dose was decreased   Discussed monthly self breast exams and yearly mammograms; at least 30 minutes of aerobic activity at least 5 days/week; proper sunscreen use  reviewed; healthy diet, including goals of calcium and vitamin D intake and alcohol recommendations (less than or equal to 1 drink/day) reviewed; regular seatbelt use; changing batteries in smoke detectors. Immunization recommendations discussed--UTD, will need pneumovax in future. Colonoscopy recommendations reviewed--UTD. Hemoccult kit given  Postherpetic neuralgia--significantly improved, although still some residual symptoms.  Continue on Lyrica.  Discussed tapering. If side effects are bothersome, she can try eliminating the mid-day dose for now, but not to further titrate down/off until symptoms are completely better  Osteoporosis:   She is very hesitant to take medications (despite being recommended and samples given in 07/2011).  At this point she would prefer to have the repeat DEXA done first, prior to being willing to take medication.  She doesn't recall any adverse effects from the Actonel weekly she was previously given, and would be willing to re-try if she is told to (after getting results from DEXA).  She is also past due for mammogram--will need to schedule both with [facility]

## 2013-07-27 NOTE — Patient Instructions (Addendum)
  HEALTH MAINTENANCE RECOMMENDATIONS:  It is recommended that you get at least 30 minutes of aerobic exercise at least 5 days/week (for weight loss, you may need as much as 60-90 minutes). This can be any activity that gets your heart rate up. This can be divided in 10-15 minute intervals if needed, but try and build up your endurance at least once a week.  Weight bearing exercise is also recommended twice weekly.  Eat a healthy diet with lots of vegetables, fruits and fiber.  "Colorful" foods have a lot of vitamins (ie green vegetables, tomatoes, red peppers, etc).  Limit sweet tea, regular sodas and alcoholic beverages, all of which has a lot of calories and sugar.  Up to 1 alcoholic drink daily may be beneficial for women (unless trying to lose weight, watch sugars).  Drink a lot of water.  Calcium recommendations are 1200-1500 mg daily (1500 mg for postmenopausal women or women without ovaries), and vitamin D 1000 IU daily.  This should be obtained from diet and/or supplements (vitamins), and calcium should not be taken all at once, but in divided doses.  Monthly self breast exams and yearly mammograms for women over the age of 30 is recommended.  Sunscreen of at least SPF 30 should be used on all sun-exposed parts of the skin when outside between the hours of 10 am and 4 pm (not just when at beach or pool, but even with exercise, golf, tennis, and yard work!)  Use a sunscreen that says "broad spectrum" so it covers both UVA and UVB rays, and make sure to reapply every 1-2 hours.  Remember to change the batteries in your smoke detectors when changing your clock times in the spring and fall.  Use your seat belt every time you are in a car, and please drive safely and not be distracted with cell phones and texting while driving.  I don't recommend starting to taper OFF the Lyrica until you feel 100% back to normal. If you are not tolerating the side effects, you can stop the mid-day dose and see if  you develop worsening symptoms, vs can maintain at that dose (3/day) until you eventually further taper down/off. Eventually taper off by cutting back 1 pill every 1-2 weeks.  Please call [facility] to schedule your  Mammogram and your bone density (I gave you a written prescription that will function as your "order").  You will most likely need to be on bone-building medication, as was previously recommended, but we will wait until your results from test, as you prefer.  We discussed probiotics to try, if needed, to help with your constipation.  Align is one that I have had success with, but there are others as well that would work.

## 2013-07-28 ENCOUNTER — Encounter: Payer: Self-pay | Admitting: Family Medicine

## 2013-07-28 LAB — VITAMIN D 25 HYDROXY (VIT D DEFICIENCY, FRACTURES): VIT D 25 HYDROXY: 42 ng/mL (ref 30–89)

## 2013-12-08 LAB — HM MAMMOGRAPHY: HM MAMMO: NEGATIVE

## 2013-12-09 ENCOUNTER — Encounter: Payer: Self-pay | Admitting: Internal Medicine

## 2013-12-28 ENCOUNTER — Telehealth: Payer: Self-pay | Admitting: Obstetrics & Gynecology

## 2013-12-28 NOTE — Telephone Encounter (Signed)
Call to patient to discuss referral and appointment options.  Advised Dr Sabra Heck feels she will need PUS.  Limited appointments available this week. Patient is willing to be flexible.  Agrees to 11 am NGYN tomm 12-29-13 and will have to retrun for PUS at 2pm.  She is aware Dr Sabra Heck is coming from surgery and I will call her is any delay. Patient agreeable.  Is this ok with you?

## 2013-12-28 NOTE — Telephone Encounter (Signed)
That is fine.  Thank you.  Encounter closed.

## 2013-12-28 NOTE — Telephone Encounter (Signed)
Patient called wanting to check the status of getting a ngyn appt with dr Sabra Heck for a referral from dr Collene Mares. Advised patient we were waiting to hear back from dr Sabra Heck and as soon as we did we would give her a call

## 2013-12-29 ENCOUNTER — Ambulatory Visit (INDEPENDENT_AMBULATORY_CARE_PROVIDER_SITE_OTHER): Payer: Medicare Other | Admitting: Obstetrics & Gynecology

## 2013-12-29 ENCOUNTER — Encounter: Payer: Self-pay | Admitting: Obstetrics & Gynecology

## 2013-12-29 ENCOUNTER — Other Ambulatory Visit (INDEPENDENT_AMBULATORY_CARE_PROVIDER_SITE_OTHER): Payer: Medicare Other

## 2013-12-29 VITALS — BP 100/62 | HR 64 | Resp 20 | Ht 66.25 in | Wt 149.0 lb

## 2013-12-29 DIAGNOSIS — E039 Hypothyroidism, unspecified: Secondary | ICD-10-CM

## 2013-12-29 DIAGNOSIS — R140 Abdominal distension (gaseous): Secondary | ICD-10-CM

## 2013-12-29 DIAGNOSIS — N9489 Other specified conditions associated with female genital organs and menstrual cycle: Secondary | ICD-10-CM

## 2013-12-29 DIAGNOSIS — R141 Gas pain: Secondary | ICD-10-CM

## 2013-12-29 DIAGNOSIS — R142 Eructation: Secondary | ICD-10-CM

## 2013-12-29 DIAGNOSIS — R143 Flatulence: Secondary | ICD-10-CM

## 2013-12-29 DIAGNOSIS — N83209 Unspecified ovarian cyst, unspecified side: Secondary | ICD-10-CM

## 2013-12-29 NOTE — Progress Notes (Signed)
68 y.o. [id number] MarriedCaucasianF here for new pt exam.  Pt referred from Dr. Collene Mares.. Pt reports having some issues with thyroid starting about 9 months ago.  Synthroid was as high as as 267mcg but now down to 31mcg.  At first, she felt her heart was racing and went to see Dr. Tomi Bamberger for this.  Pt has been followed now for several months.  She has had some intermittent bowel issues through this time but about 4 weeks ago she started have increased LLQ pain with cramping and diarrhea.  (Of note, she states she is frustrated she hasn't lost any weight with all the diarrhea.)  Pain radiates down left side into her hip and leg.  Denies fever or nausea.  Did have a normal colonoscopy 11/14 with Dr. Collene Mares.  Has seen Dr. Collene Mares and has been on antibiotics which pt states really didn't seem to help.  Has recently done stool cultures which pt doesn't know results of at this time.    History of hysterectomy at age 62 due to fibroids and heavy bleeding.  Ovaries and tubes were left.  Was never on HRT.  Paternal grandmother had ovarian and pancreatic cancers, although pt is not completely sure of these diagnoses as these are two "bad" cancers.   No LMP recorded. Patient has had a hysterectomy.          Sexually active: Yes.    The current method of family planning is status post hysterectomy.    Exercising: Yes.    walking Smoker:  no  Health Maintenance: Pap:  2004? History of abnormal Pap:  no MMG:  6/15-normal-per patient Colonoscopy:  11/15-repeat in 10 years, Dr Collene Mares BMD:   osteoporosis TDaP:  04/09/11 Screening Labs: PCP, Hb today: PCP, Urine today:  Not done   reports that she has never smoked. She has never used smokeless tobacco. She reports that she drinks alcohol. She reports that she does not use illicit drugs.  Past Medical History  Diagnosis Date  . Hypothyroidism (acquired) 12/2005    history of thyroid cancer and thyroidectomy; Dr. Chalmers Cater  . GERD (gastroesophageal reflux disease)   .  Osteoporosis   . Internal hemorrhoids 06/30/2002    Dr.Mann  . Shingles 06/2013    affecting R eye and scalp  . Hyperthyroidism     Past Surgical History  Procedure Laterality Date  . Abdominal hysterectomy  1987    still has ovaries. (benign reasons)  . Thyroidectomy  12/2005    Current Outpatient Prescriptions  Medication Sig Dispense Refill  . dexlansoprazole (DEXILANT) 60 MG capsule Take 1 capsule (60 mg total) by mouth daily.  30 capsule  6  . levothyroxine (SYNTHROID, LEVOTHROID) 75 MCG tablet Take 75 mcg by mouth daily before breakfast.      . Probiotic Product (PROBIOTIC DAILY PO) Take by mouth.      . Rifaximin (XIFAXAN PO) Take by mouth. One tablet BID x 10 days       No current facility-administered medications for this visit.    Family History  Problem Relation Age of Onset  . Dementia Mother   . Cancer Maternal Grandmother 80    breast cancer  . Breast cancer Maternal Grandmother   . Cancer Paternal Grandmother     ? pancreatic   . Hypothyroidism Daughter   . Ovarian cancer Paternal Grandmother     ROS:  Pertinent items are noted in HPI.  Otherwise, a comprehensive ROS was negative.  Exam:   BP 100/62  Pulse 64  Resp 20  Ht 5' 6.25" (1.683 m)  Wt 149 lb (67.586 kg)  BMI 23.86 kg/m2     Height: 5' 6.25" (168.3 cm)  Ht Readings from Last 3 Encounters:  12/29/13 5' 6.25" (1.683 m)  07/27/13 5\' 6"  (1.676 m)  06/22/13 5\' 6"  (1.676 m)    General appearance: alert, cooperative and appears stated age Head: Normocephalic, without obvious abnormality, atraumatic Lungs: clear to auscultation bilaterally Breasts: normal appearance, no masses or tenderness Heart: regular rate and rhythm Abdomen: soft, non-tender; bowel sounds normal; no masses,  no organomegaly Extremities: extremities normal, atraumatic, no cyanosis or edema Skin: Skin color, texture, turgor normal. No rashes or lesions Lymph nodes: Cervical, supraclavicular, and axillary nodes normal. No  abnormal inguinal nodes palpated Neurologic: Grossly normal   Pelvic: External genitalia:  no lesions              Urethra:  normal appearing urethra with no masses, tenderness or lesions              Bartholins and Skenes: normal                 Vagina: normal appearing vagina with normal color and discharge, no lesions              Cervix: absent              Pap taken: No. Bimanual Exam:  Uterus:  uterus absent              Adnexa: no masses, cannot palpate ovaries, tenderness along left side near colon               Rectovaginal: Confirms               Anus:  normal sphincter tone, no lesions  Ultrasound performed while pt in office today.  Ovaries small and atropic with right ovary containing a 6.3KZ simple cyst follicle.  No free fluid.  No masses.  There is some edema in the bowel wall along the left descending colon.  Results reviewed with pt.  No evidence of malignancy.   A:  LLQ pain Diarrhea Family hx of ovarian cancer in grandmother H/o thyroid cancer with thyroidectomy and subsequent hypothryoidism  P:   Ca-125 pending. Doubtful it will be abnormal. Results will be shared with Dr. Collene Mares This is not reason for pt's consult but I do think she should proceed with a nuclear scan due to the continued decreased need for thyroid supplementation.  ~45 minutes spent with patient >50% of time was in face to face discussion of above.  An After Visit Summary was printed and given to the patient.   Sonographer's Note:  Uterus has been surgically removed. Vag cuff WNL. Lt ovary WNL - atrophic appearance Rt ovary with 1.0 cm simple follicle with no suspicious bloodflow. Area of bowel in LLQ with hypoechoic wall - suggestive of inflammation - tender to palpation. No free fluid. SR Ysidro Evert, RDMS

## 2013-12-31 ENCOUNTER — Telehealth: Payer: Self-pay

## 2013-12-31 LAB — CA 125: CA 125: 10.3 U/mL (ref 0.0–30.2)

## 2013-12-31 NOTE — Telephone Encounter (Signed)
Lmtcb//kn 

## 2013-12-31 NOTE — Telephone Encounter (Signed)
Message copied by Robley Fries on Thu Dec 31, 2013  9:04 AM ------      Message from: Megan Salon      Created: Thu Dec 31, 2013  7:17 AM       Inform ca-125 is normal.  Copy sent to Dr. Collene Mares.  Please let pt know I did inform dr. Collene Mares everything was normal on her visit and ultrasound. ------

## 2014-01-11 NOTE — Telephone Encounter (Signed)
Patient notified of all results.//kn 

## 2014-03-24 ENCOUNTER — Other Ambulatory Visit: Payer: Self-pay | Admitting: Family Medicine

## 2014-04-05 ENCOUNTER — Encounter: Payer: Self-pay | Admitting: Obstetrics & Gynecology

## 2014-05-27 ENCOUNTER — Encounter: Payer: Self-pay | Admitting: Family Medicine

## 2014-05-27 ENCOUNTER — Ambulatory Visit (INDEPENDENT_AMBULATORY_CARE_PROVIDER_SITE_OTHER): Payer: Medicare Other | Admitting: Family Medicine

## 2014-05-27 VITALS — BP 116/78 | HR 74 | Temp 98.1°F | Ht 65.5 in | Wt 151.0 lb

## 2014-05-27 DIAGNOSIS — R059 Cough, unspecified: Secondary | ICD-10-CM

## 2014-05-27 DIAGNOSIS — R140 Abdominal distension (gaseous): Secondary | ICD-10-CM

## 2014-05-27 DIAGNOSIS — R05 Cough: Secondary | ICD-10-CM

## 2014-05-27 DIAGNOSIS — J069 Acute upper respiratory infection, unspecified: Secondary | ICD-10-CM

## 2014-05-27 MED ORDER — BENZONATATE 200 MG PO CAPS: 200.0000 mg | ORAL_CAPSULE | Freq: Three times a day (TID) | ORAL | Status: DC | PRN

## 2014-05-27 NOTE — Progress Notes (Signed)
Chief Complaint  Patient presents with  . cough    cough for about a week, tried otc med but nothing is working. at night is the worst. some chest congestion   One week of drainage, coughing, postnasal drainage, tightness in throat and chest.  Only getting a little nasal drainage, slightly bloody when she blows. No purulent drainage.  No sinus headaches.  She had some chills a couple of nights ago, no known fevers. Cough is sometimes productive of clearish-white (only once saw slight yellow); mainly is a dry cough--feels like phlegm is hung in her throat and she can't get it up. Cough is worse at night.  She is only here today because her husband has been complaining about her coughing at night.  She had some mucinex and two leftover tessalon (that she took earlier in the week, helped, but ran out).  She has also used some nasal saline.  She is continuing to complain of bloating, explosive gas and diarrhea.  She has seen Dr. Collene Mares, as well as her GYN.  She had eval of her ovaries to r/o cancer, and was treated with xifaxan without any improvement by Dr. Collene Mares.  She notes that when she was on a mission trip and only eating rice, her symptoms got much better, but recurred quickly upon returning home.  PMH, PSH, SH reviewed.  Outpatient Encounter Prescriptions as of 05/27/2014  Medication Sig  . DEXILANT 60 MG capsule TAKE 1 CAPSULE BY MOUTH DAILY.  Marland Kitchen levothyroxine (SYNTHROID, LEVOTHROID) 75 MCG tablet Take 75 mcg by mouth daily before breakfast.  . Probiotic Product (PROBIOTIC DAILY PO) Take by mouth.  . [DISCONTINUED] Rifaximin (XIFAXAN PO) Take by mouth. One tablet BID x 10 days   Allergies  Allergen Reactions  . Codeine Anaphylaxis  . Prednisone     Nausea and vomiting   ROS:  Denies fevers, chills, nausea, vomiting, headaches, dizziness, chest pain (some congestion/heaviness), palpitations, rash, bleeding/bruising.  Ongoing issues with bloating and diarrhea, per HPI.  No urinary  complaints.  PHYSICAL EXAM: BP 116/78 mmHg  Pulse 74  Temp(Src) 98.1 F (36.7 C) (Temporal)  Ht 5' 5.5" (1.664 m)  Wt 151 lb (68.493 kg)  BMI 24.74 kg/m2  SpO2 98% Well-appearing female, with hoarse voice and occasional dry cough.  Speaking easily in full sentences HEENT: PERRL, EOMI, conjunctiva clear. TM's and EAC's normal. Nasal mucosa only minimally edematous, no drainage or erythema.  Sinuses nontender. OP is normal Neck: no lymphadenopathy or mass Heart: regular rate and rhythm Lungs: clear bilaterally  ASSESSMENT/PLAN:  Cough - Plan: benzonatate (TESSALON) 200 MG capsule  Acute upper respiratory infection  Gassiness - along with bloating and diarrhea.  resolved on rice diet. keep food journal. stop sugar-free gum. imodium prn. Cont probiotic   Drink plenty of fluids. Resume Mucinex (guaifenesin) to keep the mucus thin. Use tessalon as needed for cough. Call or return if worsening cough, fever. Return for re-evaluation if pain with breathing, shortness of breath, high fever  Try and keep food diary to try and pinpoint what might be contributing to your gas. Avoid sugar-free gum. Use imodium and gas-x and/or Beano as needed

## 2014-05-27 NOTE — Patient Instructions (Signed)
  Drink plenty of fluids. Resume Mucinex (guaifenesin) to keep the mucus thin. Use tessalon as needed for cough. Call or return if worsening cough, fever. Return for re-evaluation if pain with breathing, shortness of breath, high fever  Try and keep food diary to try and pinpoint what might be contributing to your gas. Avoid sugar-free gum. Use imodium and gas-x and/or Beano as needed

## 2014-06-05 ENCOUNTER — Other Ambulatory Visit: Payer: Self-pay | Admitting: Family Medicine

## 2014-06-07 ENCOUNTER — Encounter: Payer: Self-pay | Admitting: Family Medicine

## 2014-06-07 ENCOUNTER — Ambulatory Visit (INDEPENDENT_AMBULATORY_CARE_PROVIDER_SITE_OTHER): Payer: Medicare Other | Admitting: Family Medicine

## 2014-06-07 VITALS — BP 102/62 | HR 60 | Temp 97.6°F | Ht 65.5 in | Wt 151.0 lb

## 2014-06-07 DIAGNOSIS — J0110 Acute frontal sinusitis, unspecified: Secondary | ICD-10-CM

## 2014-06-07 DIAGNOSIS — J029 Acute pharyngitis, unspecified: Secondary | ICD-10-CM | POA: Diagnosis not present

## 2014-06-07 LAB — POCT RAPID STREP A (OFFICE): Rapid Strep A Screen: NEGATIVE

## 2014-06-07 MED ORDER — AMOXICILLIN 500 MG PO TABS: 1000.0000 mg | ORAL_TABLET | Freq: Two times a day (BID) | ORAL | Status: DC

## 2014-06-07 NOTE — Patient Instructions (Signed)
  Drink plenty of fluids. Use saline drops as needed for eye irritation.  You may also try antihistamine drops (ie Naphcon-A or other allergy drops). You can also use oral antihistamines to help with eye itching (ie claritin or zyrtec).  Your sore throat is likely related to postnasal drainage from a sinus infection. Use tylenol or ibuprofen as needed for pain, along with salt water gargles. Consider using decongestant (ie phenylephrine or pseudoephedrine) to help with sinus pressure and dry up the drainage.  Mucinex (guaifenesin) to keep the mucus thin might also help. Take the antibiotics as prescribed. Contact us if your symptoms worsen despite treatment.

## 2014-06-07 NOTE — Progress Notes (Signed)
Chief Complaint  Patient presents with  . Sore Throat    cough is somewhat better, but ST started this past Thursday ([facility]).  Also mentions that her right eye is itchy and burns around the lid. Mentions that she took one of her husband's amoxil last evening. (strep-neg)    She was seen 12/24 with one week of cold symptoms.  She was prescribed Tessalon Perles for the cough. She states that the tessalon really didn't work at all this time, but she used an OTC cough and congestion medication.  Her cough has resolved.  She started complaining of a sore throat 4 days ago, and has been persistent.  It hurts to swallow, eat, and is constant throughout the day.  It hurts more on the right than the left side.  She still has some head congestion.  Mucus was clear, then got discolored, but is now clearing up again.    She has been doing salt water gargles.  Has not used tylenol or ibuprofen.  She hasn't had any fevers.  She admits to taking a dose of her husband's amoxicillin.  She denies any sick contacts or known strep exposure (did see sick grandkids over the holidays).  She is also complaining of her right eye burning and itching.  This is the eye she previously had shingles in.  Denies blurred vision or eye pain.  No known trauma or injury.  No drainage or crusting.  PMH, PSH, SH reviewed.  Outpatient Encounter Prescriptions as of 06/07/2014  Medication Sig  . DEXILANT 60 MG capsule TAKE 1 CAPSULE BY MOUTH DAILY.  Marland Kitchen levothyroxine (SYNTHROID, LEVOTHROID) 75 MCG tablet Take 75 mcg by mouth daily before breakfast.  . Probiotic Product (PROBIOTIC DAILY PO) Take by mouth.  . [DISCONTINUED] benzonatate (TESSALON) 200 MG capsule Take 1 capsule (200 mg total) by mouth 3 (three) times daily as needed. (Patient not taking: Reported on 06/07/2014)  . [DISCONTINUED] Phenylephrine-DM-GG-APAP (MUCINEX FAST-MAX SEVERE COLD) 5-10-200-325 MG/10ML LIQD Take 15 mLs by mouth as needed.   Allergies  Allergen Reactions  .  Codeine Anaphylaxis  . Prednisone     Nausea and vomiting   ROS: No fevers, chills, dizziness.  She is having some frontal headaches.  No nausea or vomiting, skin rashes, bleeding/bruising, arthralgias, myalgias. No chest pain, shortness of breath. See HPI  PHYSICAL EXAM: BP 102/62 mmHg  Pulse 60  Temp(Src) 97.6 F (36.4 C) (Tympanic)  Ht 5' 5.5" (1.664 m)  Wt 151 lb (68.493 kg)  BMI 24.74 kg/m2 Well appearing female in no distress HEENT: PERRL, EOMI, conjunctiva clear. TM's and EAC's normal.  Nasal mucosa is mildly edematous with white drainage.  Mild erythema of mucosa.  OP is clear.  Minimal erythema.  Tonsils are normal. No ulcers or lesions.  She is tender over frontal sinuses bilaterally; nontender over maxillary sinuses. Neck: no lymphadenopathy or mass Heart: regular rate and rhythm Lungs; clear bilaterally Skin: no rashes Neuro: alert and oriented. Cranial nerves intact  Rapid strep: negative  ASSESSMENT/PLAN:  Acute frontal sinusitis, recurrence not specified - Plan: amoxicillin (AMOXIL) 500 MG tablet  Sore throat - Plan: Rapid Strep A   Drink plenty of fluids. Use saline drops as needed for eye irritation.  You may also try antihistamine drops (ie Naphcon-A or other allergy drops). You can also use oral antihistamines to help with eye itching (ie claritin or zyrtec).  Your sore throat is likely related to postnasal drainage from a sinus infection. Use tylenol or ibuprofen as needed  for pain, along with salt water gargles. Consider using decongestant (ie phenylephrine or pseudoephedrine) to help with sinus pressure and dry up the drainage.  Mucinex (guaifenesin) to keep the mucus thin might also help. Take the antibiotics as prescribed. Contact us if your symptoms worsen despite treatment.

## 2014-07-19 ENCOUNTER — Other Ambulatory Visit: Payer: Self-pay | Admitting: Family Medicine

## 2014-07-19 NOTE — Telephone Encounter (Signed)
Is this okay to refill? Due for CPE end of this month, nothing scheduled. Should I schedule her a med check?

## 2014-07-19 NOTE — Telephone Encounter (Signed)
Ok to refill and schedule PE (no need for med check just for this)

## 2014-08-04 DIAGNOSIS — S022XXA Fracture of nasal bones, initial encounter for closed fracture: Secondary | ICD-10-CM | POA: Diagnosis not present

## 2014-10-15 ENCOUNTER — Telehealth: Payer: Self-pay | Admitting: Family Medicine

## 2014-10-15 NOTE — Telephone Encounter (Signed)
Pt's insurance company, [employer], called to schedule this pt an [employer]. I advised him I would check with you and call pt back. Please advise how you would like this scheduled. You are out until November for CPE's.

## 2014-10-15 NOTE — Telephone Encounter (Signed)
She was due for a CPE in 07/2014 (and was told that when she called for med refill in 07/2014, but appears to still never have scheduled one).  Looks like she had the same insurance last year, so I'm guessing that her insurance covers physicals too?   Please double check that physicals are covered, and if so, please schedule her one (and put her on cancellation list).   If they don't cover physicals, then schedule it as a med check+/AWV. If they cover physicals, and it also appears that they cover AWV, she doesn't need to wait until November to have AWV, she can come sooner, but she needs to be aware that it does NOT take the place of a physical--this is just a review of her history and of her health maintenance, but no labs or physical is done.  It is covered by her insurance (apparently, since they called). If she doesn't want the inconvenience of coming twice, we probably can do AWV at her physical (I don't usually bill for both if they cover a physical, but I guess I can...).  If she prefers the latter (doing both together), please note that on the appointment (CPE and AWV, insurance covers both) or else I wont think to do both.  Hope that helps!

## 2014-12-27 DIAGNOSIS — M25561 Pain in right knee: Secondary | ICD-10-CM | POA: Diagnosis not present

## 2014-12-27 DIAGNOSIS — M79671 Pain in right foot: Secondary | ICD-10-CM | POA: Diagnosis not present

## 2015-01-17 DIAGNOSIS — M25571 Pain in right ankle and joints of right foot: Secondary | ICD-10-CM | POA: Diagnosis not present

## 2015-01-17 DIAGNOSIS — M79671 Pain in right foot: Secondary | ICD-10-CM | POA: Diagnosis not present

## 2015-01-17 DIAGNOSIS — M1711 Unilateral primary osteoarthritis, right knee: Secondary | ICD-10-CM | POA: Diagnosis not present

## 2015-01-19 ENCOUNTER — Other Ambulatory Visit: Payer: Self-pay | Admitting: Family Medicine

## 2015-04-20 ENCOUNTER — Ambulatory Visit (INDEPENDENT_AMBULATORY_CARE_PROVIDER_SITE_OTHER): Payer: Medicare Other | Admitting: Family Medicine

## 2015-04-20 ENCOUNTER — Encounter: Payer: Self-pay | Admitting: Family Medicine

## 2015-04-20 VITALS — BP 118/68 | HR 64 | Ht 66.0 in | Wt 160.4 lb

## 2015-04-20 DIAGNOSIS — R197 Diarrhea, unspecified: Secondary | ICD-10-CM

## 2015-04-20 DIAGNOSIS — Z1159 Encounter for screening for other viral diseases: Secondary | ICD-10-CM | POA: Diagnosis not present

## 2015-04-20 DIAGNOSIS — Z23 Encounter for immunization: Secondary | ICD-10-CM | POA: Diagnosis not present

## 2015-04-20 DIAGNOSIS — E039 Hypothyroidism, unspecified: Secondary | ICD-10-CM

## 2015-04-20 DIAGNOSIS — K219 Gastro-esophageal reflux disease without esophagitis: Secondary | ICD-10-CM

## 2015-04-20 DIAGNOSIS — Z0000 Encounter for general adult medical examination without abnormal findings: Secondary | ICD-10-CM

## 2015-04-20 DIAGNOSIS — M810 Age-related osteoporosis without current pathological fracture: Secondary | ICD-10-CM | POA: Diagnosis not present

## 2015-04-20 LAB — CBC WITH DIFFERENTIAL/PLATELET
BASOS PCT: 0 % (ref 0–1)
Basophils Absolute: 0 10*3/uL (ref 0.0–0.1)
EOS ABS: 0.2 10*3/uL (ref 0.0–0.7)
EOS PCT: 2 % (ref 0–5)
HCT: 37.3 % (ref 36.0–46.0)
Hemoglobin: 12.5 g/dL (ref 12.0–15.0)
Lymphocytes Relative: 17 % (ref 12–46)
Lymphs Abs: 1.3 10*3/uL (ref 0.7–4.0)
MCH: 29.5 pg (ref 26.0–34.0)
MCHC: 33.5 g/dL (ref 30.0–36.0)
MCV: 88 fL (ref 78.0–100.0)
MONO ABS: 0.7 10*3/uL (ref 0.1–1.0)
MONOS PCT: 9 % (ref 3–12)
MPV: 10.1 fL (ref 8.6–12.4)
NEUTROS PCT: 72 % (ref 43–77)
Neutro Abs: 5.6 10*3/uL (ref 1.7–7.7)
PLATELETS: 327 10*3/uL (ref 150–400)
RBC: 4.24 MIL/uL (ref 3.87–5.11)
RDW: 14 % (ref 11.5–15.5)
WBC: 7.8 10*3/uL (ref 4.0–10.5)

## 2015-04-20 LAB — POCT URINALYSIS DIPSTICK
BILIRUBIN UA: NEGATIVE
Glucose, UA: NEGATIVE
KETONES UA: NEGATIVE
Leukocytes, UA: NEGATIVE
NITRITE UA: NEGATIVE
PROTEIN UA: NEGATIVE
RBC UA: NEGATIVE
Spec Grav, UA: 1.02
Urobilinogen, UA: NEGATIVE
pH, UA: 7

## 2015-04-20 LAB — LIPID PANEL
Cholesterol: 211 mg/dL — ABNORMAL HIGH (ref 125–200)
HDL: 96 mg/dL (ref >=46)
LDL CALC: 102 mg/dL (ref <130)
Total CHOL/HDL Ratio: 2.2 Ratio (ref <=5.0)
Triglycerides: 63 mg/dL (ref <150)
VLDL: 13 mg/dL (ref <30)

## 2015-04-20 LAB — COMPREHENSIVE METABOLIC PANEL
ALBUMIN: 3.9 g/dL (ref 3.6–5.1)
ALK PHOS: 93 U/L (ref 33–130)
ALT: 14 U/L (ref 6–29)
AST: 22 U/L (ref 10–35)
BILIRUBIN TOTAL: 0.6 mg/dL (ref 0.2–1.2)
BUN: 17 mg/dL (ref 7–25)
CO2: 25 mmol/L (ref 20–31)
Calcium: 8.6 mg/dL (ref 8.6–10.4)
Chloride: 105 mmol/L (ref 98–110)
Creat: 0.69 mg/dL (ref 0.50–0.99)
Glucose, Bld: 71 mg/dL (ref 65–99)
Potassium: 3.9 mmol/L (ref 3.5–5.3)
Sodium: 141 mmol/L (ref 135–146)
TOTAL PROTEIN: 6.8 g/dL (ref 6.1–8.1)

## 2015-04-20 NOTE — Progress Notes (Signed)
Chief Complaint  Patient presents with  . Annual Exam    fasting CPE/AWV with pelvic. [facility] nurse came to house and told her to make sure she has vitamin D level done at CPE. Still having ongoing GI issues even though she sees Dr.Mann.   Barbara Parker is a 69 y.o. female who presents for annual wellness visit and follow-up on chronic medical conditions.  She has the following concerns:  Osteoporosis: She had DEXA 04/2011 which showed osteoporosis. She came for a visit 07/2011 to discuss the results and medications. She was given samples of Actonel weekly. She recalls taking the samples, but doesn't recall if there were any problems. She never followed up or called for prescription. She is very hesitant to take any medications.  We discussed this at her last CPE in 07/2013, and she preferred to wait until a follow up DEXA was done to make any decision on medications for osteoporosis.  She never had that test scheduled, but it is on her to-do list to do today, along with scheduling her mammogram.  Hypothyroidism: Dose was decreased to 150 mcg of Synthroid mid-October 2014. It was gradually reduced every few months, and is currently on 36mg, under the care of Dr. BChalmers Cater She reports that her last TSH was normal.  She has another appointment scheduled for January for re-check.. Denies hair or skin changes (slightly dry skin)  Continues to have food "go right through her". Diarrhea after she eats.  No weight loss. Stools are loose, foul smelling. Denies blood or mucus in stool  GERD: She takes Dexilant about every other day. If she goes 3 days without the medication, she will get recurrent symptoms when lying down at night. Denies dysphagia.  Shingles/Postherpetic neuralgia:  She has been off the Lyrica, and hasn't had any ongoing pain. She does have some residual itching over her right eyebrow, and decreased hearing in the right ear. She saw audiologist at Dr. RJaneice Robinsonoffice, and hearing aid was  suggested.   Immunization History  Administered Date(s) Administered  . Influenza Split 04/09/2011, 03/25/2012  . Influenza, High Dose Seasonal PF 03/16/2013, 03/29/2015  . Pneumococcal Conjugate-13 03/16/2013  . Tdap 04/09/2011  . Zoster 05/15/2011   Last Pap smear: 2004; s/p hysterectomy (for fibroids) Last mammogram: 12/2013 Last colonoscopy: 04/2012 Dr. MCollene Mares [facility]. Dentist: twice yearly  Ophtho: yearly Exercise: Does zumba and weight machines only once every other week. Occasionally walks, yardwork.  Other doctors caring for patient include: Ophtho: Dr. OSyrian Arab RepublicDentist: Dr. BPhilipp OvensGI:  Dr. MCollene MaresEndocrinologist: Dr. BChalmers CaterOrtho: Dr. BMaxie Better(stress fracture R foot after fall 11/2014) ENT: Dr. RConstance Holster(for broken nose from fall 11/2014) Derm: Dr. LAllyson Sabal  Depression screen:  Negative (see epic questionnaire) Fall screen: 1 fall--broke nose and stress fracture in her foot 11/2014.  Face planted when her sandal got stuck in a crack in the sidewalk. ADL screen:  Notable for decreased hearing in the right ear (since having shingles on that side).  End of Life Discussion:  Patient has a living will and medical power of attorney  Past Medical History  Diagnosis Date  . Hypothyroidism (acquired) 12/2005    history of thyroid cancer and thyroidectomy; Dr. BChalmers Cater . GERD (gastroesophageal reflux disease)   . Osteoporosis     not on treatment  . Internal hemorrhoids 06/30/2002    Dr.Mann  . Shingles 06/2013    affecting R eye and scalp  . Hyperthyroidism  Past Surgical History  Procedure Laterality Date  . Abdominal hysterectomy  1987    still has ovaries. (benign reasons)  . Thyroidectomy  12/2005    Social History   Social History  . Marital Status: Married    Spouse Name: N/A  . Number of Children: 2  . Years of Education: N/A   Occupational History  . 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 Smoking status: Never Smoker   . Smokeless tobacco: Never Used  . Alcohol Use: Yes     Comment: glass or two of wine maybe twice a year.  . Drug Use: No  . Sexual Activity:    Partners: Male     Comment: not much   Other Topics Concern  . Not on file   Social History Narrative   Lives with her husband.  Son in [place], Daughter in [place]. 6 grandkids.   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 Dementia Mother   . Breast cancer Maternal Grandmother 70       . Hypothyroidism Daughter   . Ovarian cancer Paternal Grandmother     and pancreatic  . Diabetes Neg Hx     Outpatient Encounter Prescriptions as of 04/20/2015  Medication Sig Note  . DEXILANT 60 MG capsule TAKE 1 CAPSULE BY MOUTH DAILY. 04/20/2015: Takes every other day  . levothyroxine (SYNTHROID, LEVOTHROID) 75 MCG tablet Take 75 mcg by mouth daily before breakfast.   . vitamin B-12 (CYANOCOBALAMIN) 100 MCG tablet Take 100 mcg by mouth daily. 04/20/2015: She isn't sure of the dose.  Takes 1 B12 pill daily  . [DISCONTINUED] amoxicillin (AMOXIL) 500 MG tablet Take 2 tablets (1,000 mg total) by mouth 2 (two) times daily.   . [DISCONTINUED] Probiotic Product (PROBIOTIC DAILY PO) Take by mouth.    No facility-administered encounter medications on file as of 04/20/2015.    Allergies  Allergen Reactions  . Codeine Anaphylaxis  . Prednisone     Nausea and vomiting   ROS: The patient denies anorexia, fever, vision changes, ear pain, sore throat, breast concerns, chest pain, palpitations, dizziness, syncope, dyspnea on exertion, cough, swelling, nausea, vomiting, constipation, abdominal pain, melena, hematochezia, indigestion/heartburn, hematuria, incontinence, dysuria, vaginal bleeding, discharge, odor or itch, genital lesions, joint pains, numbness, tingling, weakness, tremor, suspicious skin lesions, depression,  anxiety, abnormal bleeding/bruising, or enlarged lymph nodes.  +rare tingling in feet or hands, depending on position (when sleeping), unchanged. Takes Dexilant every other day without any recurrence of cough or heartburn. No dysphagia. Chronically bruises easily, unchanged. Mild urinary urgency, no incontinence. Diarrhea after eating, foul-smelling.  No weight loss (significant gain in the last year). +gas, some bloating decreased hearing in right ear +weight gain   PHYSICAL EXAM:  BP 118/68 mmHg  Pulse 64  Ht _0  (1.676 m)  Wt 160 lb 6.4 oz (72.757 kg)  BMI 25.90 kg/m2  General Appearance:  Alert, cooperative, no distress, appears stated age   Head:  Normocephalic, without obvious abnormality, atraumatic   Eyes:  PERRL, conjunctiva/corneas clear, EOM's intact, fundi benign. No longer has any rash, swelling  Ears:  Normal TM's and external ear canals   Nose:  Nares normal, mucosa normal, no drainage or sinus tenderness   Throat:  Lips, mucosa, and tongue normal; teeth and gums normal   Neck:  Supple, no lymphadenopathy; thyroid: no enlargement/tenderness/nodules; no  carotid  bruit or JVD   Back:  Spine nontender, no curvature, ROM normal, no CVA tenderness   Lungs:  Clear to auscultation bilaterally without wheezes, rales or ronchi; respirations unlabored   Chest Wall:  No tenderness or deformity   Heart:  Regular rate and rhythm, S1 and S2 normal, no murmur, rub  or gallop   Breast Exam:  No tenderness, masses, or nipple discharge or inversion. No axillary lymphadenopathy   Abdomen:  Soft, non-tender, nondistended, active bowel sounds,  no masses, no hepatosplenomegaly   Genitalia:  Normal external genitalia without lesions. BUS and vagina normal; Uterus absent; and adnexa not enlarged, not palpable, nontender, no masses. Pap not performed   Rectal:  Normal tone, no masses or tenderness; no stool in rectum   Extremities:  No clubbing,  cyanosis or edema   Pulses:  2+ and symmetric all extremities   Skin:  Skin color, texture, turgor normal, no rashes or lesions   Lymph nodes:  Cervical, supraclavicular, and axillary nodes normal   Neurologic:  CNII-XII intact, normal strength, sensation and gait; reflexes 2+ and symmetric throughout    Psych: Normal mood, affect, hygiene and grooming.         ASSESSMENT/PLAN:   Annual physical exam - Plan: POCT Urinalysis Dipstick, Visual acuity screening, Lipid panel, Comprehensive metabolic panel, CBC with Differential/Platelet, VITAMIN D 25 Hydroxy (Vit-D Deficiency, Fractures), Hepatitis C antibody  Osteoporosis - recheck DEXA, likely needs meds; reviewed calcium, vitamin D and weight-bearing exercise - Plan: VITAMIN D 25 Hydroxy (Vit-D Deficiency, Fractures)  Hypothyroidism, acquired - f/u as scheduled with Dr. Chalmers Cater  Gastroesophageal reflux disease, esophagitis presence not specified - controlled  Need for hepatitis C screening test - Plan: Hepatitis C antibody  Diarrhea, unspecified type - lactose-fee trial, consider gluten-free trial; probiotics; f/u with Dr. Collene Mares if persists. - Plan: Comprehensive metabolic panel, CBC with Differential/Platelet  Immunization due - Plan: Pneumococcal polysaccharide vaccine 23-valent greater than or equal to 2yo subcutaneous/IM  Initial Medicare annual wellness visit   Discussed monthly self breast exams and yearly mammograms (past due and will schedule); at least 30 minutes of aerobic activity at least 5 days/week; proper sunscreen use reviewed; healthy diet, including goals of calcium and vitamin D intake and alcohol recommendations (less than or equal to 1 drink/day) reviewed; regular seatbelt use; changing batteries in smoke detectors. Immunization recommendations discussed--pneumovax today. Colonoscopy recommendations reviewed--UTD. Hemasure kit given.  She was asked to get copies of Living Will and  healthcare POA to our office.  New forms were given, if needed. She is Full Code, Full Care.   I would try lactose-free diet (no dairy) for 1-2 weeks, along with probiotic. You can also consider trying gluten-free diet for a couple of weeks.  If your diarrhea with eating doesn't improve, despite this dietary measure, along with avoiding fried/greasy foods, then you can see if you want to try an OTC digestive enzyme.  Other thing we could try are bile acid salts (ie Questran).  This is usually for people who have diarrhea after their gall bladder has been removed.  Consider following up with Dr. Collene Mares if it doesn't improve despite these measures.  Sometimes colonoscopies are repeated (even though not needed for colon cancer screening) to evaluate for colitis, with biopsies.  Medicare Attestation I have personally reviewed: The patient's medical and social history Their use of alcohol, tobacco or illicit drugs Their current medications and supplements The patient's functional ability including ADLs,fall risks, home safety risks, cognitive, and hearing  and visual impairment Diet and physical activities Evidence for depression or mood disorders  The patient's weight, height, BMI, and visual acuity have been recorded in the chart.  I have made referrals, counseling, and provided education to the patient based on review of the above and I have provided the patient with a written personalized care plan for preventive services.     Arilynn Blakeney A, MD   04/20/2015

## 2015-04-20 NOTE — Patient Instructions (Signed)
HEALTH MAINTENANCE RECOMMENDATIONS:  It is recommended that you get at least 30 minutes of aerobic exercise at least 5 days/week (for weight loss, you may need as much as 60-90 minutes). This can be any activity that gets your heart rate up. This can be divided in 10-15 minute intervals if needed, but try and build up your endurance at least once a week.  Weight bearing exercise is also recommended twice weekly.  Eat a healthy diet with lots of vegetables, fruits and fiber.  "Colorful" foods have a lot of vitamins (ie green vegetables, tomatoes, red peppers, etc).  Limit sweet tea, regular sodas and alcoholic beverages, all of which has a lot of calories and sugar.  Up to 1 alcoholic drink daily may be beneficial for women (unless trying to lose weight, watch sugars).  Drink a lot of water.  Calcium recommendations are 1200-1500 mg daily (1500 mg for postmenopausal women or women without ovaries), and vitamin D 1000 IU daily.  This should be obtained from diet and/or supplements (vitamins), and calcium should not be taken all at once, but in divided doses.  Monthly self breast exams and yearly mammograms for women over the age of 57 is recommended.  Sunscreen of at least SPF 30 should be used on all sun-exposed parts of the skin when outside between the hours of 10 am and 4 pm (not just when at beach or pool, but even with exercise, golf, tennis, and yard work!)  Use a sunscreen that says "broad spectrum" so it covers both UVA and UVB rays, and make sure to reapply every 1-2 hours.  Remember to change the batteries in your smoke detectors when changing your clock times in the spring and fall.  Use your seat belt every time you are in a car, and please drive safely and not be distracted with cell phones and texting while driving.  I would try lactose-free diet (no dairy) for 1-2 weeks, along with probiotic. You can also consider trying gluten-free diet for a couple of weeks.  If your diarrhea with  eating doesn't improve, despite this dietary measure, along with avoiding fried/greasy foods, then you can see if you want to try an OTC digestive enzyme.  Other thing we could try are bile acid salts (ie Questran).  This is usually for people who have diarrhea after their gall bladder has been removed.  Consider following up with Dr. Collene Mares if it doesn't improve despite these measures.  Sometimes colonoscopies are repeated (even though not needed for colon cancer screening) to evaluate for colitis, with biopsies.    Ms. Lavoy , Thank you for taking time to come for your Medicare Wellness Visit. I appreciate your ongoing commitment to your health goals. Please review the following plan we discussed and let me know if I can assist you in the future.   These are the goals we discussed: Goals    None      This is a list of the screening recommended for you and due dates:  Health Maintenance  Topic Date Due  .  Hepatitis C: One time screening is recommended by Center for Disease Control  (CDC) for  adults born from [birth year] through 1963.   [birth year]  . Pneumonia vaccines (2 of 2 - PPSV23) 03/16/2014  . Flu Shot  01/03/2015  . Mammogram  12/09/2015  . Tetanus Vaccine  04/08/2021  . Colon Cancer Screening  04/11/2022  . DEXA scan (bone density measurement)  Completed  . Shingles Vaccine  Completed   We are checking your hepatitis C screen today You are getting the pneumonia vaccine today Your mammogram is due now (it says 0000000 only because certain guidelines say it can be every 2 years, but I recommend it yearly) Bone density is past due (says completed because if it was normal, you only need one). Please call [facility] and schedule your bone density and mammogram.

## 2015-04-21 LAB — VITAMIN D 25 HYDROXY (VIT D DEFICIENCY, FRACTURES): Vit D, 25-Hydroxy: 29 ng/mL — ABNORMAL LOW (ref 30–100)

## 2015-04-21 LAB — HEPATITIS C ANTIBODY: HCV Ab: NEGATIVE

## 2015-05-05 DIAGNOSIS — H43812 Vitreous degeneration, left eye: Secondary | ICD-10-CM | POA: Diagnosis not present

## 2015-05-20 DIAGNOSIS — M810 Age-related osteoporosis without current pathological fracture: Secondary | ICD-10-CM | POA: Diagnosis not present

## 2015-05-20 DIAGNOSIS — Z1231 Encounter for screening mammogram for malignant neoplasm of breast: Secondary | ICD-10-CM | POA: Diagnosis not present

## 2015-05-20 LAB — HM MAMMOGRAPHY

## 2015-05-20 LAB — HM DEXA SCAN

## 2015-05-31 DIAGNOSIS — Z1239 Encounter for other screening for malignant neoplasm of breast: Secondary | ICD-10-CM | POA: Diagnosis not present

## 2015-05-31 DIAGNOSIS — R922 Inconclusive mammogram: Secondary | ICD-10-CM | POA: Diagnosis not present

## 2015-05-31 LAB — HM MAMMOGRAPHY

## 2015-06-01 ENCOUNTER — Encounter: Payer: Self-pay | Admitting: Family Medicine

## 2015-06-01 ENCOUNTER — Telehealth: Payer: Self-pay

## 2015-06-01 NOTE — Telephone Encounter (Signed)
Called patient and reported DEXA results and Dr. Tomi Bamberger recommendation for treatment of either Actonel, Boniva or Reclast. Patient will check with her insurance regarding coverage and call back for appointment to discuss.

## 2015-06-07 ENCOUNTER — Other Ambulatory Visit: Payer: Medicare Other

## 2015-06-07 DIAGNOSIS — Z1211 Encounter for screening for malignant neoplasm of colon: Secondary | ICD-10-CM | POA: Diagnosis not present

## 2015-06-08 ENCOUNTER — Encounter: Payer: Self-pay | Admitting: Family Medicine

## 2015-06-08 LAB — FECAL OCCULT BLOOD, IMMUNOCHEMICAL: Fecal Occult Blood: NEGATIVE

## 2015-06-09 ENCOUNTER — Other Ambulatory Visit: Payer: Self-pay | Admitting: Family Medicine

## 2015-06-15 ENCOUNTER — Ambulatory Visit (INDEPENDENT_AMBULATORY_CARE_PROVIDER_SITE_OTHER): Payer: Medicare Other | Admitting: Family Medicine

## 2015-06-15 ENCOUNTER — Encounter: Payer: Self-pay | Admitting: Family Medicine

## 2015-06-15 VITALS — BP 102/62 | HR 64 | Ht 66.0 in | Wt 162.4 lb

## 2015-06-15 DIAGNOSIS — M810 Age-related osteoporosis without current pathological fracture: Secondary | ICD-10-CM | POA: Diagnosis not present

## 2015-06-15 DIAGNOSIS — Z5181 Encounter for therapeutic drug level monitoring: Secondary | ICD-10-CM | POA: Diagnosis not present

## 2015-06-15 LAB — CALCIUM: CALCIUM: 9.1 mg/dL (ref 8.6–10.4)

## 2015-06-15 LAB — CREATININE, SERUM: CREATININE: 0.75 mg/dL (ref 0.50–0.99)

## 2015-06-15 NOTE — Progress Notes (Signed)
Chief Complaint  Patient presents with  . Results    here to discuss DEXA results.    Patient presents to discuss treatment options for osteoporosis.  She has discussed these with her son Warehouse manager), and has questions regarding Reclast and Prolia--she prefers this over oral bisphosphonates.  Evaluation revealed she had a mild vitamin D deficiency, with recent level of 29.  She has been taking OTC Vitamin D 1000 IU since getting the results. Her DEXA at [facility] showed T-3.4 at lowest site (spine), but had osteoporosis noted at multiple sites.   PMH, PSH, SH reviewed.  Outpatient Encounter Prescriptions as of 06/15/2015  Medication Sig Note  . cholecalciferol (VITAMIN D) 1000 units tablet Take 1,000 Units by mouth daily.   Marland Kitchen DEXILANT 60 MG capsule TAKE 1 CAPSULE BY MOUTH DAILY.   Marland Kitchen levothyroxine (SYNTHROID, LEVOTHROID) 75 MCG tablet Take 75 mcg by mouth daily before breakfast.   . vitamin B-12 (CYANOCOBALAMIN) 100 MCG tablet Take 100 mcg by mouth daily. 04/20/2015: She isn't sure of the dose.  Takes 1 B12 pill daily   No facility-administered encounter medications on file as of 06/15/2015.   Allergies  Allergen Reactions  . Codeine Anaphylaxis  . Prednisone     Nausea and vomiting   ROS: no chest pain, jaw pain, hip pain, dysphagia or other GI complaints. No fever, chills, URI symptoms, shortness of breath, bleeding, bruising, rash or other concerns.  PHYSICAL EXAM: BP 102/62 mmHg  Pulse 64  Ht 5\' 6"  (1.676 m)  Wt 162 lb 6.4 oz (73.664 kg)  BMI 26.22 kg/m2  Well developed, pleasant female in no distress. DEXA results were reviewed in detail with patient.      Chemistry      Component Value Date/Time   NA 141 04/20/2015 0001   K 3.9 04/20/2015 0001   CL 105 04/20/2015 0001   CO2 25 04/20/2015 0001   BUN 17 04/20/2015 0001   CREATININE 0.69 04/20/2015 0001   CREATININE 0.72 08/02/2012 0215      Component Value Date/Time   CALCIUM 8.6 04/20/2015 0001   ALKPHOS 93 04/20/2015  0001   AST 22 04/20/2015 0001   ALT 14 04/20/2015 0001   BILITOT 0.6 04/20/2015 0001     ASSESSMENT/PLAN:  Osteoporosis - Plan: Calcium, Creatinine, serum  Medication monitoring encounter - Plan: Calcium, Creatinine, serum  Discussed risks/side effects of bisphosphonates and of Prolia.  She has no contraindications to bisphosphonates, so I recommend Reclast. Ca 8.6 with alb of 3.9 on 11/16, with normal creatinine. Ca and Cr needs to be within 30d of infusion, so re-draw today. Order will be faxed to infusion center after labs back.  All questions answered. Discussed importance of calcium, vitamin D, and weight bearing exercise .  F/u DEXA in 2 years.

## 2015-06-15 NOTE — Patient Instructions (Addendum)
Osteoporosis Osteoporosis is the thinning and loss of density in the bones. Osteoporosis makes the bones more brittle, fragile, and likely to break (fracture). Over time, osteoporosis can cause the bones to become so weak that they fracture after a simple fall. The bones most likely to fracture are the bones in the hip, wrist, and spine. CAUSES  The exact cause is not known. RISK FACTORS Anyone can develop osteoporosis. You may be at greater risk if you have a family history of the condition or have poor nutrition. You may also have a higher risk if you are:   Female.   68 years old or older.  A smoker.  Not physically active.   White or Asian.  Slender. SIGNS AND SYMPTOMS  A fracture might be the first sign of the disease, especially if it results from a fall or injury that would not usually cause a bone to break. Other signs and symptoms include:   Low back and neck pain.  Stooped posture.  Height loss. DIAGNOSIS  To make a diagnosis, your health care provider may:  Take a medical history.  Perform a physical exam.  Order tests, such as:  A bone mineral density test.  A dual-energy X-ray absorptiometry test. TREATMENT  The goal of osteoporosis treatment is to strengthen your bones to reduce your risk of a fracture. Treatment may involve:  Making lifestyle changes, such as:  Eating a diet rich in calcium.  Doing weight-bearing and muscle-strengthening exercises.  Stopping tobacco use.  Limiting alcohol intake.  Taking medicine to slow the process of bone loss or to increase bone density.  Monitoring your levels of calcium and vitamin D. HOME CARE INSTRUCTIONS  Include calcium and vitamin D in your diet. Calcium is important for bone health, and vitamin D helps the body absorb calcium.  Perform weight-bearing and muscle-strengthening exercises as directed by your health care provider.  Do not use any tobacco products, including cigarettes, chewing  tobacco, and electronic cigarettes. If you need help quitting, ask your health care provider.  Limit your alcohol intake.  Take medicines only as directed by your health care provider.  Keep all follow-up visits as directed by your health care provider. This is important.  Take precautions at home to lower your risk of falling, such as:  Keeping rooms well lit and clutter free.  Installing safety rails on stairs.  Using rubber mats in the bathroom and other areas that are often wet or slippery. SEEK IMMEDIATE MEDICAL CARE IF:  You fall or injure yourself.    This information is not intended to replace advice given to you by your health care provider. Make sure you discuss any questions you have with your health care provider.   Document Released: 02/28/2005 Document Revised: 06/11/2014 Document Reviewed: 10/29/2013 Elsevier Interactive Patient Education 2016 Elsevier Inc.  Zoledronic Acid injection (Paget's Disease, Osteoporosis) What is this medicine? ZOLEDRONIC ACID (ZOE le dron ik AS id) lowers the amount of calcium loss from bone. It is used to treat Paget's disease and osteoporosis in women. This medicine may be used for other purposes; ask your health care provider or pharmacist if you have questions. What should I tell my health care provider before I take this medicine? They need to know if you have any of these conditions: -aspirin-sensitive asthma -cancer, especially if you are receiving medicines used to treat cancer -dental disease or wear dentures -infection -kidney disease -low levels of calcium in the blood -past surgery on the parathyroid gland or  intestines -receiving corticosteroids like dexamethasone or prednisone -an unusual or allergic reaction to zoledronic acid, other medicines, foods, dyes, or preservatives -pregnant or trying to get pregnant -breast-feeding How should I use this medicine? This medicine is for infusion into a vein. It is given by a  health care professional in a hospital or clinic setting. Talk to your pediatrician regarding the use of this medicine in children. This medicine is not approved for use in children. Overdosage: If you think you have taken too much of this medicine contact a poison control center or emergency room at once. NOTE: This medicine is only for you. Do not share this medicine with others. What if I miss a dose? It is important not to miss your dose. Call your doctor or health care professional if you are unable to keep an appointment. What may interact with this medicine? -certain antibiotics given by injection -NSAIDs, medicines for pain and inflammation, like ibuprofen or naproxen -some diuretics like bumetanide, furosemide -teriparatide This list may not describe all possible interactions. Give your health care provider a list of all the medicines, herbs, non-prescription drugs, or dietary supplements you use. Also tell them if you smoke, drink alcohol, or use illegal drugs. Some items may interact with your medicine. What should I watch for while using this medicine? Visit your doctor or health care professional for regular checkups. It may be some time before you see the benefit from this medicine. Do not stop taking your medicine unless your doctor tells you to. Your doctor may order blood tests or other tests to see how you are doing. Women should inform their doctor if they wish to become pregnant or think they might be pregnant. There is a potential for serious side effects to an unborn child. Talk to your health care professional or pharmacist for more information. You should make sure that you get enough calcium and vitamin D while you are taking this medicine. Discuss the foods you eat and the vitamins you take with your health care professional. Some people who take this medicine have severe bone, joint, and/or muscle pain. This medicine may also increase your risk for jaw problems or a broken  thigh bone. Tell your doctor right away if you have severe pain in your jaw, bones, joints, or muscles. Tell your doctor if you have any pain that does not go away or that gets worse. Tell your dentist and dental surgeon that you are taking this medicine. You should not have major dental surgery while on this medicine. See your dentist to have a dental exam and fix any dental problems before starting this medicine. Take good care of your teeth while on this medicine. Make sure you see your dentist for regular follow-up appointments. What side effects may I notice from receiving this medicine? Side effects that you should report to your doctor or health care professional as soon as possible: -allergic reactions like skin rash, itching or hives, swelling of the face, lips, or tongue -anxiety, confusion, or depression -breathing problems -changes in vision -eye pain -feeling faint or lightheaded, falls -jaw pain, especially after dental work -mouth sores -muscle cramps, stiffness, or weakness -redness, blistering, peeling or loosening of the skin, including inside the mouth -trouble passing urine or change in the amount of urine Side effects that usually do not require medical attention (report to your doctor or health care professional if they continue or are bothersome): -bone, joint, or muscle pain -constipation -diarrhea -fever -hair loss -irritation at site  where injected -loss of appetite -nausea, vomiting -stomach upset -trouble sleeping -trouble swallowing -weak or tired This list may not describe all possible side effects. Call your doctor for medical advice about side effects. You may report side effects to FDA at 1-800-FDA-1088. Where should I keep my medicine? This drug is given in a hospital or clinic and will not be stored at home. NOTE: This sheet is a summary. It may not cover all possible information. If you have questions about this medicine, talk to your doctor,  pharmacist, or health care provider.    2016, Elsevier/Gold Standard. (2013-10-17 14:19:57)  We will be faxing the order once your lab results are back (tomorrow).  You should get a call from someone to schedule the infusion and answer your questions within the week--let us know if you don't hear from anyone.

## 2015-06-16 DIAGNOSIS — C73 Malignant neoplasm of thyroid gland: Secondary | ICD-10-CM | POA: Diagnosis not present

## 2015-06-16 DIAGNOSIS — E890 Postprocedural hypothyroidism: Secondary | ICD-10-CM | POA: Diagnosis not present

## 2015-06-17 ENCOUNTER — Encounter: Payer: Self-pay | Admitting: Family Medicine

## 2015-06-22 ENCOUNTER — Encounter: Payer: Self-pay | Admitting: Family Medicine

## 2015-06-23 ENCOUNTER — Encounter: Payer: Self-pay | Admitting: Family Medicine

## 2015-06-23 DIAGNOSIS — C73 Malignant neoplasm of thyroid gland: Secondary | ICD-10-CM | POA: Diagnosis not present

## 2015-06-23 DIAGNOSIS — E890 Postprocedural hypothyroidism: Secondary | ICD-10-CM | POA: Diagnosis not present

## 2015-06-27 ENCOUNTER — Encounter (HOSPITAL_COMMUNITY): Payer: Self-pay

## 2015-06-27 DIAGNOSIS — H11433 Conjunctival hyperemia, bilateral: Secondary | ICD-10-CM | POA: Diagnosis not present

## 2015-06-27 DIAGNOSIS — D485 Neoplasm of uncertain behavior of skin: Secondary | ICD-10-CM | POA: Diagnosis not present

## 2015-06-30 ENCOUNTER — Telehealth: Payer: Self-pay | Admitting: *Deleted

## 2015-06-30 NOTE — Telephone Encounter (Signed)
Patient was referred to Dr.Zaldivar by Dr.Oman to be seen once and she is having a procedure to cut out cysts that have developed in her lashline secondary to her shingles. Specialist is asking for a letter stating that she was referred on 06/27/15 and that it is okay for her to receive treatment. They said that the letter needs to be from PCP even though Dr.Oman referred her in order for insurance to pay. Is this okay to send a letter stating this? If so, fax to [phone] Tel [phone], Morey Hummingbird is the person that I spoke with.

## 2015-06-30 NOTE — Telephone Encounter (Signed)
[place] for letter stating the needed information.  Please type letter.

## 2015-07-04 ENCOUNTER — Other Ambulatory Visit (HOSPITAL_COMMUNITY): Payer: Self-pay | Admitting: *Deleted

## 2015-07-05 ENCOUNTER — Ambulatory Visit (HOSPITAL_COMMUNITY)
Admission: RE | Admit: 2015-07-05 | Discharge: 2015-07-05 | Disposition: A | Discharge status: Home / Self Care | Payer: Medicare Other | Source: Ambulatory Visit | Attending: Family Medicine | Admitting: Family Medicine

## 2015-07-05 ENCOUNTER — Encounter: Payer: Self-pay | Admitting: Family Medicine

## 2015-07-05 DIAGNOSIS — M810 Age-related osteoporosis without current pathological fracture: Secondary | ICD-10-CM | POA: Insufficient documentation

## 2015-07-05 MED ORDER — ZOLEDRONIC ACID 5 MG/100ML IV SOLN
5.0000 mg | Freq: Once | INTRAVENOUS | Status: AC
  Administered 2015-07-05: 5 mg via INTRAVENOUS

## 2015-07-05 MED ORDER — ZOLEDRONIC ACID 5 MG/100ML IV SOLN
INTRAVENOUS | Status: AC
  Administered 2015-07-05: 5 mg via INTRAVENOUS
  Filled 2015-07-05: qty 100

## 2015-07-05 NOTE — Telephone Encounter (Signed)
Letter typed & faxed

## 2015-07-06 ENCOUNTER — Emergency Department (HOSPITAL_COMMUNITY)
Admission: EM | Admit: 2015-07-06 | Discharge: 2015-07-06 | Disposition: A | Discharge status: Home / Self Care | Payer: Medicare Other | Attending: Emergency Medicine | Admitting: Emergency Medicine

## 2015-07-06 ENCOUNTER — Encounter (HOSPITAL_COMMUNITY): Payer: Self-pay | Admitting: Vascular Surgery

## 2015-07-06 ENCOUNTER — Telehealth: Payer: Self-pay | Admitting: Family Medicine

## 2015-07-06 ENCOUNTER — Ambulatory Visit: Payer: Self-pay | Admitting: Family Medicine

## 2015-07-06 DIAGNOSIS — Z79899 Other long term (current) drug therapy: Secondary | ICD-10-CM | POA: Insufficient documentation

## 2015-07-06 DIAGNOSIS — E039 Hypothyroidism, unspecified: Secondary | ICD-10-CM | POA: Diagnosis not present

## 2015-07-06 DIAGNOSIS — Z8619 Personal history of other infectious and parasitic diseases: Secondary | ICD-10-CM | POA: Diagnosis not present

## 2015-07-06 DIAGNOSIS — R1110 Vomiting, unspecified: Secondary | ICD-10-CM | POA: Diagnosis not present

## 2015-07-06 DIAGNOSIS — T50905A Adverse effect of unspecified drugs, medicaments and biological substances, initial encounter: Secondary | ICD-10-CM

## 2015-07-06 DIAGNOSIS — T375X5A Adverse effect of antiviral drugs, initial encounter: Secondary | ICD-10-CM | POA: Diagnosis not present

## 2015-07-06 DIAGNOSIS — K219 Gastro-esophageal reflux disease without esophagitis: Secondary | ICD-10-CM | POA: Diagnosis not present

## 2015-07-06 DIAGNOSIS — M810 Age-related osteoporosis without current pathological fracture: Secondary | ICD-10-CM | POA: Diagnosis not present

## 2015-07-06 DIAGNOSIS — T50995A Adverse effect of other drugs, medicaments and biological substances, initial encounter: Secondary | ICD-10-CM | POA: Insufficient documentation

## 2015-07-06 DIAGNOSIS — R197 Diarrhea, unspecified: Secondary | ICD-10-CM | POA: Insufficient documentation

## 2015-07-06 LAB — URINALYSIS, ROUTINE W REFLEX MICROSCOPIC
BILIRUBIN URINE: NEGATIVE
Glucose, UA: NEGATIVE mg/dL
HGB URINE DIPSTICK: NEGATIVE
Ketones, ur: 15 mg/dL — AB
Leukocytes, UA: NEGATIVE
NITRITE: NEGATIVE
PH: 8 (ref 5.0–8.0)
Protein, ur: NEGATIVE mg/dL
SPECIFIC GRAVITY, URINE: 1.012 (ref 1.005–1.030)

## 2015-07-06 LAB — COMPREHENSIVE METABOLIC PANEL
ALBUMIN: 3.7 g/dL (ref 3.5–5.0)
ALT: 19 U/L (ref 14–54)
ANION GAP: 10 (ref 5–15)
AST: 30 U/L (ref 15–41)
Alkaline Phosphatase: 111 U/L (ref 38–126)
BUN: 13 mg/dL (ref 6–20)
CO2: 23 mmol/L (ref 22–32)
Calcium: 8.7 mg/dL — ABNORMAL LOW (ref 8.9–10.3)
Chloride: 105 mmol/L (ref 101–111)
Creatinine, Ser: 0.75 mg/dL (ref 0.44–1.00)
GFR calc non Af Amer: >60 mL/min (ref >60)
GLUCOSE: 100 mg/dL — AB (ref 65–99)
POTASSIUM: 4 mmol/L (ref 3.5–5.1)
SODIUM: 138 mmol/L (ref 135–145)
Total Bilirubin: 0.9 mg/dL (ref 0.3–1.2)
Total Protein: 7.6 g/dL (ref 6.5–8.1)

## 2015-07-06 LAB — CBC
HEMATOCRIT: 39.7 % (ref 36.0–46.0)
HEMOGLOBIN: 13.5 g/dL (ref 12.0–15.0)
MCH: 29.9 pg (ref 26.0–34.0)
MCHC: 34 g/dL (ref 30.0–36.0)
MCV: 87.8 fL (ref 78.0–100.0)
Platelets: 280 10*3/uL (ref 150–400)
RBC: 4.52 MIL/uL (ref 3.87–5.11)
RDW: 13.9 % (ref 11.5–15.5)
WBC: 8.6 10*3/uL (ref 4.0–10.5)

## 2015-07-06 MED ORDER — FENTANYL CITRATE (PF) 100 MCG/2ML IJ SOLN
50.0000 ug | Freq: Once | INTRAMUSCULAR | Status: AC
  Administered 2015-07-06: 50 ug via INTRAVENOUS
  Filled 2015-07-06: qty 2

## 2015-07-06 MED ORDER — ONDANSETRON 8 MG PO TBDP: 8.0000 mg | ORAL_TABLET | Freq: Three times a day (TID) | ORAL | Status: DC | PRN

## 2015-07-06 MED ORDER — ONDANSETRON HCL 4 MG/2ML IJ SOLN
4.0000 mg | Freq: Once | INTRAMUSCULAR | Status: AC | PRN
  Administered 2015-07-06: 4 mg via INTRAVENOUS
  Filled 2015-07-06: qty 2

## 2015-07-06 MED ORDER — SODIUM CHLORIDE 0.9 % IV BOLUS (SEPSIS)
1000.0000 mL | Freq: Once | INTRAVENOUS | Status: AC
  Administered 2015-07-06: 1000 mL via INTRAVENOUS

## 2015-07-06 MED ORDER — TRAMADOL HCL 50 MG PO TABS: 50.0000 mg | ORAL_TABLET | Freq: Four times a day (QID) | ORAL | Status: DC | PRN

## 2015-07-06 NOTE — Telephone Encounter (Addendum)
Pt's daughter, Larence Penning, called saying that pt had reclast infusion yesterday and this morning at 3:00am she starting vomiting, diarrhea, chills. She is very ill, can not keep anything down and is home alone because husband is at [employer]. Daughter has been speaking to pt over the phone is very concerned because she does not seem to be doing well at all. A neighbor is willing to get the pt here to an appt if she needs to come in or to the hosp if Dr Tomi Bamberger thinks that is best. Larence Penning is requesting to be called backat [phone]  because she does not think the pt will be able to get to the phone right away however daughter is not on HIPAA

## 2015-07-06 NOTE — ED Notes (Signed)
Pt reports to the ED for eval of N/V/D, abd cramping, and severe joint pain. She reports she received an infusion of reclast yesterday and states that last night at approx 3:30 am. Pt also reports chills. Unsure of fevers or blood in her emesis or stool. This is her first time receiving reclast. Denies any sick contacts. Pt A&OX4, resp e/u, and skin warm and dry.

## 2015-07-06 NOTE — ED Provider Notes (Signed)
CSN: [id number]     Arrival date & time 07/06/15  [id number] History   First MD Initiated Contact with Patient 07/06/15 1005     Chief Complaint  Patient presents with  . Emesis  . Diarrhea    HPI Pt received an injection of reclast yesterday.  This was her first time receiving it.  She felt fine before the injection.  She started having aching last night then woke up at 3am with vomiting and diarrhea.  She vomited numerous times.  She had maybe 6 episodes of diarrhea.  She is also having a lot of aching in the joints.  No fevers.  No abdominal pain.  She has not noticed any swelling. No rashes. Past Medical History  Diagnosis Date  . Hypothyroidism (acquired) 12/2005    history of thyroid cancer and thyroidectomy; Dr. Chalmers Cater  . GERD (gastroesophageal reflux disease)   . Osteoporosis     not on treatment  . Internal hemorrhoids 06/30/2002    Dr.Mann  . Shingles 06/2013    affecting R eye and scalp  . Hyperthyroidism    Past Surgical History  Procedure Laterality Date  . Abdominal hysterectomy  1987    still has ovaries. (benign reasons)  . Thyroidectomy  12/2005   Family History  Problem Relation Age of Onset  . Dementia Mother   . Breast cancer Maternal Grandmother 45       . Hypothyroidism Daughter   . Ovarian cancer Paternal Grandmother     and pancreatic  . Diabetes Neg Hx    Social History  Substance Use Topics  . Smoking status: Never Smoker   . Smokeless tobacco: Never Used  . Alcohol Use: Yes     Comment: glass or two of wine maybe twice a year.   OB History    Gravida Para Term Preterm AB TAB SAB Ectopic Multiple Living   2 2 2       2      Review of Systems  Constitutional: Negative for fever.  Respiratory: Negative for shortness of breath.   Cardiovascular: Negative for chest pain.  Genitourinary: Negative for dysuria.  All other systems reviewed and are negative.     Allergies  Codeine; Reclast; and Prednisone  Home Medications   Prior to Admission  medications   Medication Sig Start Date End Date Taking? Authorizing Provider  cholecalciferol (VITAMIN D) 1000 units tablet Take 1,000 Units by mouth daily.   Yes Historical Provider, MD  DEXILANT 60 MG capsule TAKE 1 CAPSULE BY MOUTH DAILY. 06/09/15  Yes Rita Ohara, MD  SYNTHROID 100 MCG tablet Take 100 mcg by mouth daily. 06/23/15  Yes Historical Provider, MD  vitamin B-12 (CYANOCOBALAMIN) 100 MCG tablet Take 100 mcg by mouth daily.   Yes Historical Provider, MD   BP 92/64 mmHg  Pulse 81  Temp(Src) 98.1 F (36.7 C)  Resp 14  SpO2 99% Physical Exam  Constitutional: She appears well-developed and well-nourished. No distress.  HENT:  Head: Normocephalic and atraumatic.  Right Ear: External ear normal.  Left Ear: External ear normal.  Eyes: Conjunctivae are normal. Right eye exhibits no discharge. Left eye exhibits no discharge. No scleral icterus.  Neck: Neck supple. No tracheal deviation present.  Cardiovascular: Normal rate, regular rhythm and intact distal pulses.   Pulmonary/Chest: Effort normal and breath sounds normal. No stridor. No respiratory distress. She has no wheezes. She has no rales.  Abdominal: Soft. Bowel sounds are normal. She exhibits no distension. There is no tenderness. There  is no rebound and no guarding.  Musculoskeletal: She exhibits no edema or tenderness.  Neurological: She is alert. She has normal strength. No cranial nerve deficit (no facial droop, extraocular movements intact, no slurred speech) or sensory deficit. She exhibits normal muscle tone. She displays no seizure activity. Coordination normal.  Skin: Skin is warm and dry. No rash noted.  Psychiatric: She has a normal mood and affect.  Nursing note and vitals reviewed.   ED Course  Procedures (including critical care time) Labs Review Labs Reviewed  COMPREHENSIVE METABOLIC PANEL - Abnormal; Notable for the following:    Glucose, Bld 100 (*)    Calcium 8.7 (*)    All other components within normal  limits  URINALYSIS, ROUTINE W REFLEX MICROSCOPIC (NOT AT [facility]) - Abnormal; Notable for the following:    Ketones, ur 15 (*)    All other components within normal limits  CBC    Medications  ondansetron (ZOFRAN) injection 4 mg (4 mg Intravenous Given 07/06/15 1039)  fentaNYL (SUBLIMAZE) injection 50 mcg (50 mcg Intravenous Given 07/06/15 1039)  sodium chloride 0.9 % bolus 1,000 mL (0 mLs Intravenous Stopped 07/06/15 1220)     MDM   Final diagnoses:  Medication adverse effect, initial encounter    The patient's laboratory tests are reassuring.  Her CBC is normal. Her metabolic panel is normal as well. She does not have any evidence of significant dehydration or acute renal injury. Urinalysis is not suggestive of a urinary tract infection.  Patient was feeling well until she received her Reclast injection.  Her symptoms are most likely related to that injection.  An acute GI illness is possible although the diffuse joint aches suggest that the sx are related to the reclast.    Pt was treated with pain meds and fluids.  She is feeling better now.  Ready for discharge.  At this time there does not appear to be any evidence of an acute emergency medical condition.  Dorie Rank, MD 07/06/15 [phone]

## 2015-07-06 NOTE — Telephone Encounter (Signed)
Patient too sick to speak on the phone, daughter is going to have neighbor check on her to assess. Scheduled patient for today at 1:30 , she will call and cancel if she needs to go to the ER.

## 2015-07-06 NOTE — Telephone Encounter (Signed)
V--please call.  If she sounds like she could be getting dehydrated, then ER is best options, for IV fluids, anti-emetics. She will also need labs. If she doesn't sound dehydrated, and has someone to drive her, we can see her (this afternoon)

## 2015-07-07 ENCOUNTER — Telehealth: Payer: Self-pay | Admitting: Family Medicine

## 2015-07-07 NOTE — Telephone Encounter (Signed)
Message was sent to me so I called her to get more info. She states that she feels about the same as when she left ER yesterday (meaning better than when she went in but still not great). Still having pain her bones and knees, tramadol helps some. Very nauseous still and dry heaving from time to time, zofran does not help. She really wants to know if there is anything else she should be doing to try to fell better nausea wise and also if you have any idea of how long this will last. Thanks.

## 2015-07-07 NOTE — Telephone Encounter (Signed)
Spoke with patient.  She has been able to keep down some toast. Continues to have a lot of dry heaves, no vomiting. Zofran doesn't seem to help very much, but took a second dose recently and was able to keep down the toast.  Only slight diarrhea today (improved).  Discussed taking her dexilant (hadn't taken in 2 days), and to contact us later (by phone or through  [place]) if she would like a different medication for nausea.  Discussed phenergan--pills vs suppositories, depending on if she can keep a pill down, or if is still having a lot of diarrhea (then no suppository).  She will get back to [place] and let us know her preference, if anything additional is needed.

## 2015-07-07 NOTE — Telephone Encounter (Signed)
Pt called and wanted to know how long this nausea and reaction is going to last..  She spent the day at the hospital and received fluids and meds.  Please call her as she is still has nausea and dry heaves.  Ph [phone]

## 2015-07-08 ENCOUNTER — Encounter: Payer: Self-pay | Admitting: Family Medicine

## 2015-07-08 MED ORDER — PROMETHAZINE HCL 25 MG PO TABS: 25.0000 mg | ORAL_TABLET | Freq: Four times a day (QID) | ORAL | Status: DC | PRN

## 2015-07-14 ENCOUNTER — Telehealth: Payer: Self-pay | Admitting: *Deleted

## 2015-07-14 NOTE — Telephone Encounter (Signed)
Patient advised.

## 2015-07-14 NOTE — Telephone Encounter (Signed)
Patient called and states that her body still aches so badly. Her neck, spine, elbows, just all of her body and joints. She has been taking "tons" of Tylenol, at least four a day. Is this still from the Reclast reaction? Not having any fevers or other symptoms, just what was mentioned. Any suggestions?

## 2015-07-14 NOTE — Telephone Encounter (Signed)
Error

## 2015-07-14 NOTE — Telephone Encounter (Signed)
As long as she isn't going above the daily recommendation for tylenol (follow directions on bottle) that is okay.  If it isn't helping, she can try aleve.  If pain is severe, and periodically needs stronger med, we can refill the tramadol, if needed.  I suspect it is still ongoing from the medication.  If not getting better over the next week, let us know--I guess I can check with other docs who routinely use this med to see if they have other suggestions

## 2015-07-14 NOTE — Telephone Encounter (Signed)
Patient advised and verbalized understanding 

## 2015-08-11 DIAGNOSIS — D484 Neoplasm of uncertain behavior of peritoneum: Secondary | ICD-10-CM | POA: Diagnosis not present

## 2015-08-11 DIAGNOSIS — D2311 Other benign neoplasm of skin of right eyelid, including canthus: Secondary | ICD-10-CM | POA: Diagnosis not present

## 2015-08-22 DIAGNOSIS — E890 Postprocedural hypothyroidism: Secondary | ICD-10-CM | POA: Diagnosis not present

## 2015-08-29 DIAGNOSIS — L908 Other atrophic disorders of skin: Secondary | ICD-10-CM | POA: Diagnosis not present

## 2015-08-29 DIAGNOSIS — H02831 Dermatochalasis of right upper eyelid: Secondary | ICD-10-CM | POA: Diagnosis not present

## 2015-08-29 DIAGNOSIS — H02834 Dermatochalasis of left upper eyelid: Secondary | ICD-10-CM | POA: Diagnosis not present

## 2015-09-12 DIAGNOSIS — H53483 Generalized contraction of visual field, bilateral: Secondary | ICD-10-CM | POA: Diagnosis not present

## 2015-11-09 DIAGNOSIS — E890 Postprocedural hypothyroidism: Secondary | ICD-10-CM | POA: Diagnosis not present

## 2015-11-15 ENCOUNTER — Other Ambulatory Visit: Payer: Self-pay | Admitting: Family Medicine

## 2015-11-15 NOTE — Telephone Encounter (Signed)
Is this okay to refill? 

## 2015-12-01 DIAGNOSIS — N6001 Solitary cyst of right breast: Secondary | ICD-10-CM | POA: Diagnosis not present

## 2015-12-01 LAB — HM MAMMOGRAPHY: HM Mammogram: NORMAL (ref 0–4)

## 2015-12-05 ENCOUNTER — Encounter: Payer: Self-pay | Admitting: Family Medicine

## 2015-12-29 DIAGNOSIS — H02413 Mechanical ptosis of bilateral eyelids: Secondary | ICD-10-CM | POA: Diagnosis not present

## 2015-12-29 DIAGNOSIS — L720 Epidermal cyst: Secondary | ICD-10-CM | POA: Diagnosis not present

## 2015-12-29 DIAGNOSIS — D485 Neoplasm of uncertain behavior of skin: Secondary | ICD-10-CM | POA: Diagnosis not present

## 2016-02-22 DIAGNOSIS — Z23 Encounter for immunization: Secondary | ICD-10-CM | POA: Diagnosis not present

## 2016-03-01 ENCOUNTER — Encounter: Payer: Self-pay | Admitting: *Deleted

## 2016-04-09 ENCOUNTER — Other Ambulatory Visit: Payer: Self-pay | Admitting: Family Medicine

## 2016-04-21 NOTE — Progress Notes (Signed)
Chief Complaint  Patient presents with  . Medicare Wellness    fasting AWV/CPE with pelvic exam. Did not do eye exam as she had one with Dr.Oman 2 months ago. Has been experiencing some urinary urgency. Over the last 6-8 months she has had B/L heel pain. Right is worse than left. And also still having diarrhea, did see Dr.Mann for this.     Barbara Parker is a 70 y.o. female who presents for annual physical exam, Medicare wellness visit and follow-up on chronic medical conditions.  She has the following concerns:  Right heel pain x 6-8 months. Motrin helps.  She has some pain on the left as well, but not as often.  Shoes have good arch supports, aren't old.  Seems to notice pain with anything she wears now.  Ongoing diarrhea.  She saw Dr. Collene Mares about this in the past, but not recently.  About 2-3 times/week she has a normal stool.  She tried a few medicines in the past with Dr. Collene Mares, but it didn't really make a difference. She also tried Lactaid, eliminating milk, but also didn't help. Only occurs after eating. Describes it as food "going right through her".  She feels bloated all the time, not just related to eating.  No weight loss. Stools are loose, foul smelling, but not as bad as in the past. Denies blood or mucus in stool  Urinary urge incontinence has developed in the last 6-8 months.  She has had issues with urgency for a while, but now has some leakage on the way to the bathroom. Denies leakage with cough/sneeze.  Denies dysuria, hematuria. She admits to being busy and not going frequently to the bathroom (holding, having a full bladder).  Osteoporosis: She had DEXA 05/2015 which showed osteoporosis in the spine, T-3.4. She had Reclast infusion on 07/05/15, followed by vomiting and diarrhea and severe aches in the joints (pain lasted up to 2 weeks). Vitamin D level was slightly low at 29 last year.  Hypothyroidism: She is under the care of Dr. Chalmers Cater. She had her dose adjusted up to 112  mcg (had been down to 75 at one point in the last year).  Due again in Jan/Feb. Denies hair or skin changes (slightly dry skin, unchanged). Hair texture has improved since dose was adjusted.  GERD: She takes Dexilant about every other day. If she goes 3 days without the medication, she will get recurrent symptoms when lying down at night. Denies dysphagia.  Shingles/Postherpetic neuralgia:  She denies any pain; does have some residual itching over her right eyebrow, and decreased hearing in the right ear. She saw audiologist at Dr. Janeice Robinson office, and now has a hearing aid (for both ears).    Immunization History  Administered Date(s) Administered  . Influenza Split 04/09/2011, 03/25/2012  . Influenza, High Dose Seasonal PF 03/16/2013, 03/29/2015, 02/22/2016  . Pneumococcal Conjugate-13 03/16/2013  . Pneumococcal Polysaccharide-23 04/20/2015  . Tdap 04/09/2011  . Zoster 05/15/2011   Last Pap smear: 2004; s/p hysterectomy (for fibroids) Last mammogram: 11/2015 Last colonoscopy: 04/2012 Dr. Collene Mares  Last DEXA: 05/2015 at [facility], T-3.4 at spine Dentist: twice yearly  Ophtho: yearly Exercise: walks 45 minutes 2x/week.  No longer doing zumba since back working part-time.  Occasional weights (has weights and bands at home). Lipids: Lab Results  Component Value Date   CHOL 211 (H) 04/20/2015   HDL 96 04/20/2015   LDLCALC 102 04/20/2015   TRIG 63 04/20/2015   CHOLHDL 2.2 04/20/2015  Other doctors caring for patient include: Ophtho: Dr. Burundi Dentist: Dr. Amie Critchley GI:  Dr. Loreta Ave Endocrinologist: Dr. Talmage Nap Ortho: Dr. Jillyn Hidden (stress fracture R foot after fall 11/2014) ENT: Dr. Pollyann Kennedy (for broken nose from fall 11/2014) Derm: Dr. Terri Piedra, Dr. Danella Deis (has been seen at both offices; Dr. Danella Deis retired).   Depression screen:  Negative (see epic questionnaire) Fall screen: None in the last year (1 fall >1 year ago, in 11/2014 where she broke nose and stress fracture in her foot.  Face  planted when her sandal got stuck in a crack in the sidewalk.) ADL screen:  Notable for decreased hearing in the right>left ear (since having shingles on right side) and some leakage of urine  End of Life Discussion:  Patient has a living will and medical power of attorney  Past Medical History:  Diagnosis Date  . GERD (gastroesophageal reflux disease)   . Hyperthyroidism   . Hypothyroidism (acquired) 12/2005   history of thyroid cancer and thyroidectomy; Dr. Talmage Nap  . Internal hemorrhoids 06/30/2002   Dr.Mann  . Osteoporosis    not on treatment  . Shingles 06/2013   affecting R eye and scalp  . Thyroid cancer (HCC) 2007    Past Surgical History:  Procedure Laterality Date  . ABDOMINAL HYSTERECTOMY  1987   still has ovaries. (benign reasons)  . THYROIDECTOMY  12/2005    Social History   Social History  . Marital status: Married    Spouse name: N/A  . Number of children: 2  . Years of education: N/A   Occupational History  . 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 Smoking status: Never Smoker  . Smokeless tobacco: Never Used  . Alcohol use Yes     Comment: glass or two of wine maybe twice a year.  . Drug use: No  . Sexual activity: Yes    Partners: Male     Comment: not much   Other Topics Concern  . Not on file   Social History Narrative   Lives with her husband.  Son in [place], Daughter in [place]. 6 grandkids.   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 Dementia Mother   . Hypothyroidism Daughter   . Breast cancer Maternal Grandmother 29       . Ovarian cancer Paternal Grandmother     and pancreatic  . Diabetes Neg Hx     Outpatient Encounter Prescriptions as of 04/23/2016  Medication Sig Note  . cholecalciferol (VITAMIN D) 1000 units tablet Take 1,000 Units by mouth daily. 04/23/2016: Maybe 3 times per  week  . DEXILANT 60 MG capsule TAKE 1 CAPSULE BY MOUTH DAILY.   Marland Kitchen levothyroxine (SYNTHROID) 112 MCG tablet Take 112 mcg by mouth daily before breakfast.   . [DISCONTINUED] ondansetron (ZOFRAN ODT) 8 MG disintegrating tablet Take 1 tablet (8 mg total) by mouth every 8 (eight) hours as needed for nausea or vomiting.   . [DISCONTINUED] promethazine (PHENERGAN) 25 MG tablet Take 1 tablet (25 mg total) by mouth every 6 (six) hours as needed for nausea or vomiting.   . [DISCONTINUED] SYNTHROID 100 MCG tablet Take 100 mcg by mouth daily.   . [DISCONTINUED] traMADol (ULTRAM) 50 MG tablet Take 1 tablet (50 mg total) by mouth every 6 (six) hours as needed.   . [DISCONTINUED] vitamin B-12 (CYANOCOBALAMIN) 100 MCG tablet Take  100 mcg by mouth daily.    No facility-administered encounter medications on file as of 04/23/2016.     Allergies  Allergen Reactions  . Codeine Anaphylaxis  . Reclast [Zoledronic Acid] Diarrhea and Nausea And Vomiting    Joint pain  . Prednisone     Nausea and vomiting    ROS: The patient denies anorexia, fever, vision changes, ear pain, sore throat, breast concerns, chest pain, palpitations, dizziness, syncope, dyspnea on exertion, cough, swelling, nausea, vomiting, constipation, abdominal pain, melena, hematochezia, indigestion/heartburn (controlled with Dexilant), hematuria, dysuria, vaginal bleeding, discharge, odor or itch, genital lesions, joint pains (just heels), numbness, tingling, weakness, tremor, suspicious skin lesions, depression, anxiety, abnormal bleeding/bruising, or enlarged lymph nodes.  +rare tingling in feet or hands, depending on position (when sleeping), unchanged. Chronically bruises easily, unchanged. urinary urgency and some urge incontinence as per HPI. +gas, some bloating, and diarrhea after eating as per HPI (unchanged) decreased hearing in R>L ears--now has bilateral hearing aids. No weight changes in the last year (remains 10-15# above weight from  a few years ago). Heel pain R>L as per HPI.   PHYSICAL EXAM:  BP 110/70 (BP Location: Left Arm, Patient Position: Sitting, Cuff Size: Normal)   Pulse 64   Ht '5\' 6"'$  (1.676 m)   Wt 160 lb 12.8 oz (72.9 kg)   BMI 25.95 kg/m    General Appearance:  Alert, cooperative, no distress, appears stated age   Head:  Normocephalic, without obvious abnormality, atraumatic   Eyes:  PERRL, conjunctiva/corneas clear, EOM's intact, fundi benign.   Ears:  Normal TM's and external ear canals   Nose:  Nares normal, mucosa normal, no drainage or sinus tenderness   Throat:  Lips, mucosa, and tongue normal; teeth and gums normal   Neck:  Supple, no lymphadenopathy; thyroid: no enlargement/tenderness/nodules; no carotid bruit or JVD   Back:  Spine nontender, no curvature, ROM normal, no CVA tenderness   Lungs:  Clear to auscultation bilaterally without wheezes, rales or ronchi; respirations unlabored   Chest Wall:  No tenderness or deformity   Heart:  Regular rate and rhythm, S1 and S2 normal, no murmur, rub or gallop   Breast Exam:  No tenderness, masses, or nipple discharge or inversion. No axillary lymphadenopathy   Abdomen:  Soft, non-tender, nondistended, active bowel sounds, no masses, no hepatosplenomegaly   Genitalia:  Normal external genitalia without lesions. BUS and vagina normal; Uterus absent; and adnexa not enlarged, not palpable, nontender, no masses. Pap not performed   Rectal:  Normal tone, no masses or tenderness; heme negative, soft light brown stool  Extremities:  No clubbing, cyanosis or edema. Tender at anteromedial aspect of calcaneous L>R bilaterally  Pulses:  2+ and symmetric all extremities   Skin:  Skin color, texture, turgor normal, no rashes or lesions   Lymph nodes:  Cervical, supraclavicular, and axillary nodes normal   Neurologic:  CNII-XII intact, normal strength, sensation and gait; reflexes 2+ and symmetric throughout    Psych: Normal mood, affect, hygiene and grooming   ASSESSMENT/PLAN:   Annual physical exam - Plan: POCT Urinalysis Dipstick  Medicare annual wellness visit, subsequent  Hypothyroidism, acquired  Gastroesophageal reflux disease, esophagitis presence not specified - controlled with Dexilant  Medication monitoring encounter - Plan: CBC with Differential/Platelet, Comprehensive metabolic panel, VITAMIN D 25 Hydroxy (Vit-D Deficiency, Fractures)  Osteoporosis, unspecified osteoporosis type, unspecified pathological fracture presence - adverse reaction to Reclast.  Risks/side effects of Prolia discussed.  Will check into coverage/cost - Plan: VITAMIN D 25  Hydroxy (Vit-D Deficiency, Fractures)  Vitamin D deficiency - Plan: VITAMIN D 25 Hydroxy (Vit-D Deficiency, Fractures)  Plantar fasciitis, bilateral - counseled re: shoes, stretches, arch supports, NSAIDs. f/u with podiatrist if not resolving  Urge incontinence of urine - reviewed behavioral measures; briefly mentioned rx medications  Postprandial diarrhea - reviewed diet, probiotics   CBC, c-met, Vit D   Discussed monthly self breast exams and yearly mammograms; at least 30 minutes of aerobic activity at least 5 days/week, weight bearing exercise at least 2x/wk; proper sunscreen use reviewed; healthy diet, including goals of calcium and vitamin D intake and alcohol recommendations (less than or equal to 1 drink/day) reviewed; regular seatbelt use; changing batteries in smoke detectors. Immunization recommendations discussed--UTD; consider Shingrix when available. Colonoscopy recommendations reviewed--UTD. Hemasure kit given.  She was asked to get copies of Living Will and healthcare POA to our office.  She is Full Code, Full Care. MOST form discussed and filled out.  Send to Mickel Baas to check on Prolia cost. Pt is interested.    Try and remember to take your Vitamin D daily--you can double up if you've  missed a day, or buy the higher dose next time.  Keep it in your bathroom and take it when brushing your teeth at night (should be separate from when you take your thyroid medication).  Consider restarting a probiotic daily. Avoid dairy after you have had significant watery diarrhea (as you become temporarily lactose intolerant, even if that isn't what originally caused the diarrhea).    Medicare Attestation I have personally reviewed: The patient's medical and social history Their use of alcohol, tobacco or illicit drugs Their current medications and supplements The patient's functional ability including ADLs,fall risks, home safety risks, cognitive, and hearing and visual impairment Diet and physical activities Evidence for depression or mood disorders  The patient's weight, height, and BMI have been recorded in the chart.  I have made referrals, counseling, and provided education to the patient based on review of the above and I have provided the patient with a written personalized care plan for preventive services.     Jaycelyn Orrison A, MD   04/21/2016

## 2016-04-23 ENCOUNTER — Encounter: Payer: Self-pay | Admitting: Family Medicine

## 2016-04-23 ENCOUNTER — Ambulatory Visit (INDEPENDENT_AMBULATORY_CARE_PROVIDER_SITE_OTHER): Payer: Medicare Other | Admitting: Family Medicine

## 2016-04-23 VITALS — BP 110/70 | HR 64 | Ht 66.0 in | Wt 160.8 lb

## 2016-04-23 DIAGNOSIS — K529 Noninfective gastroenteritis and colitis, unspecified: Secondary | ICD-10-CM

## 2016-04-23 DIAGNOSIS — E559 Vitamin D deficiency, unspecified: Secondary | ICD-10-CM

## 2016-04-23 DIAGNOSIS — M810 Age-related osteoporosis without current pathological fracture: Secondary | ICD-10-CM

## 2016-04-23 DIAGNOSIS — N3941 Urge incontinence: Secondary | ICD-10-CM

## 2016-04-23 DIAGNOSIS — Z0000 Encounter for general adult medical examination without abnormal findings: Secondary | ICD-10-CM

## 2016-04-23 DIAGNOSIS — M722 Plantar fascial fibromatosis: Secondary | ICD-10-CM | POA: Diagnosis not present

## 2016-04-23 DIAGNOSIS — Z5181 Encounter for therapeutic drug level monitoring: Secondary | ICD-10-CM

## 2016-04-23 DIAGNOSIS — K219 Gastro-esophageal reflux disease without esophagitis: Secondary | ICD-10-CM

## 2016-04-23 DIAGNOSIS — E039 Hypothyroidism, unspecified: Secondary | ICD-10-CM

## 2016-04-23 LAB — POCT URINALYSIS DIPSTICK
Bilirubin, UA: NEGATIVE
Blood, UA: NEGATIVE
Glucose, UA: NEGATIVE
KETONES UA: NEGATIVE
Nitrite, UA: NEGATIVE
PH UA: 7
PROTEIN UA: NEGATIVE
SPEC GRAV UA: 1.025
Urobilinogen, UA: NEGATIVE

## 2016-04-23 LAB — CBC WITH DIFFERENTIAL/PLATELET
BASOS ABS: 0 {cells}/uL (ref 0–200)
Basophils Relative: 0 %
EOS PCT: 1 %
Eosinophils Absolute: 76 cells/uL (ref 15–500)
HCT: 40.2 % (ref 35.0–45.0)
HEMOGLOBIN: 13 g/dL (ref 11.7–15.5)
LYMPHS ABS: 1292 {cells}/uL (ref 850–3900)
Lymphocytes Relative: 17 %
MCH: 29 pg (ref 27.0–33.0)
MCHC: 32.3 g/dL (ref 32.0–36.0)
MCV: 89.7 fL (ref 80.0–100.0)
MONOS PCT: 8 %
MPV: 10.1 fL (ref 7.5–12.5)
Monocytes Absolute: 608 cells/uL (ref 200–950)
NEUTROS PCT: 74 %
Neutro Abs: 5624 cells/uL (ref 1500–7800)
Platelets: 348 10*3/uL (ref 140–400)
RBC: 4.48 MIL/uL (ref 3.80–5.10)
RDW: 14.5 % (ref 11.0–15.0)
WBC: 7.6 10*3/uL (ref 4.0–10.5)

## 2016-04-23 LAB — COMPREHENSIVE METABOLIC PANEL
ALBUMIN: 4.1 g/dL (ref 3.6–5.1)
ALT: 17 U/L (ref 6–29)
AST: 22 U/L (ref 10–35)
Alkaline Phosphatase: 94 U/L (ref 33–130)
BUN: 19 mg/dL (ref 7–25)
CHLORIDE: 103 mmol/L (ref 98–110)
CO2: 29 mmol/L (ref 20–31)
CREATININE: 0.62 mg/dL (ref 0.50–0.99)
Calcium: 9 mg/dL (ref 8.6–10.4)
Glucose, Bld: 89 mg/dL (ref 65–99)
POTASSIUM: 4.6 mmol/L (ref 3.5–5.3)
SODIUM: 140 mmol/L (ref 135–146)
Total Bilirubin: 0.6 mg/dL (ref 0.2–1.2)
Total Protein: 7 g/dL (ref 6.1–8.1)

## 2016-04-23 NOTE — Patient Instructions (Addendum)
HEALTH MAINTENANCE RECOMMENDATIONS:  It is recommended that you get at least 30 minutes of aerobic exercise at least 5 days/week (for weight loss, you may need as much as 60-90 minutes). This can be any activity that gets your heart rate up. This can be divided in 10-15 minute intervals if needed, but try and build up your endurance at least once a week.  Weight bearing exercise is also recommended twice weekly.  Eat a healthy diet with lots of vegetables, fruits and fiber.  "Colorful" foods have a lot of vitamins (ie green vegetables, tomatoes, red peppers, etc).  Limit sweet tea, regular sodas and alcoholic beverages, all of which has a lot of calories and sugar.  Up to 1 alcoholic drink daily may be beneficial for women (unless trying to lose weight, watch sugars).  Drink a lot of water.  Calcium recommendations are 1200-1500 mg daily (1500 mg for postmenopausal women or women without ovaries), and vitamin D 1000 IU daily.  This should be obtained from diet and/or supplements (vitamins), and calcium should not be taken all at once, but in divided doses.  Monthly self breast exams and yearly mammograms for women over the age of 79 is recommended.  Sunscreen of at least SPF 30 should be used on all sun-exposed parts of the skin when outside between the hours of 10 am and 4 pm (not just when at beach or pool, but even with exercise, golf, tennis, and yard work!)  Use a sunscreen that says "broad spectrum" so it covers both UVA and UVB rays, and make sure to reapply every 1-2 hours.  Remember to change the batteries in your smoke detectors when changing your clock times in the spring and fall.  Use your seat belt every time you are in a car, and please drive safely and not be distracted with cell phones and texting while driving.   Ms. Mellette , Thank you for taking time to come for your Medicare Wellness Visit. I appreciate your ongoing commitment to your health goals. Please review the following  plan we discussed and let me know if I can assist you in the future.   These are the goals we discussed: Goals    None      This is a list of the screening recommended for you and due dates:  Health Maintenance  Topic Date Due  . Mammogram  11/30/2017  . Tetanus Vaccine  04/08/2021  . Colon Cancer Screening  04/11/2022  . Flu Shot  Completed  . DEXA scan (bone density measurement)  Completed  . Shingles Vaccine  Completed  .  Hepatitis C: One time screening is recommended by Center for Disease Control  (CDC) for  adults born from [birth year] through 1963.   Completed  . Pneumonia vaccines  Completed   Consider the new Shingrix shingles vaccine when it is available. Next bone density test will be due in 05/2017. We will have Mickel Baas look into your cost for Prolia, which is the injection every 6 months to treat osteoporosis. Give [place] a call in January if you haven't heard anything from our office.  You are due for treatment the end of January.  Next mammogram is due 11/2016 (not 2019 as stated above)--I recommend them yearly, 3D.  Try and remember to take your Vitamin D daily--you can double up if you've missed a day, or buy the higher dose next time.  Keep it in your bathroom and take it when brushing your teeth at night (should  be separate from when you take your thyroid medication).  Consider restarting a probiotic daily. Avoid dairy after you have had significant watery diarrhea (as you become temporarily lactose intolerant, even if that isn't what originally caused the diarrhea).   Plantar Fasciitis Plantar fasciitis is a painful foot condition that affects the heel. It occurs when the band of tissue that connects the toes to the heel bone (plantar fascia) becomes irritated. This can happen after exercising too much or doing other repetitive activities (overuse injury). The pain from plantar fasciitis can range from mild irritation to severe pain that makes it difficult for you to walk or  move. The pain is usually worse in the morning or after you have been sitting or lying down for a while. CAUSES This condition may be caused by:  Standing for long periods of time.  Wearing shoes that do not fit.  Doing high-impact activities, including running, aerobics, and ballet.  Being overweight.  Having an abnormal way of walking (gait).  Having tight calf muscles.  Having high arches in your feet.  Starting a new athletic activity. SYMPTOMS The main symptom of this condition is heel pain. Other symptoms include:  Pain that gets worse after activity or exercise.  Pain that is worse in the morning or after resting.  Pain that goes away after you walk for a few minutes. DIAGNOSIS This condition may be diagnosed based on your signs and symptoms. Your health care provider will also do a physical exam to check for:  A tender area on the bottom of your foot.  A high arch in your foot.  Pain when you move your foot.  Difficulty moving your foot. You may also need to have imaging studies to confirm the diagnosis. These can include:  X-rays.  Ultrasound.  MRI. TREATMENT  Treatment for plantar fasciitis depends on the severity of the condition. Your treatment may include:  Rest, ice, and over-the-counter pain medicines to manage your pain.  Exercises to stretch your calves and your plantar fascia.  A splint that holds your foot in a stretched, upward position while you sleep (night splint).  Physical therapy to relieve symptoms and prevent problems in the future.  Cortisone injections to relieve severe pain.  Extracorporeal shock wave therapy (ESWT) to stimulate damaged plantar fascia with electrical impulses. It is often used as a last resort before surgery.  Surgery, if other treatments have not worked after 12 months. HOME CARE INSTRUCTIONS  Take medicines only as directed by your health care provider.  Avoid activities that cause pain.  Roll the  bottom of your foot over a bag of ice or a bottle of cold water. Do this for 20 minutes, 3-4 times a day.  Perform simple stretches as directed by your health care provider.  Try wearing athletic shoes with air-sole or gel-sole cushions or soft shoe inserts.  Wear a night splint while sleeping, if directed by your health care provider.  Keep all follow-up appointments with your health care provider. PREVENTION   Do not perform exercises or activities that cause heel pain.  Consider finding low-impact activities if you continue to have problems.  Lose weight if you need to. The best way to prevent plantar fasciitis is to avoid the activities that aggravate your plantar fascia. SEEK MEDICAL CARE IF:  Your symptoms do not go away after treatment with home care measures.  Your pain gets worse.  Your pain affects your ability to move or do your daily activities. This  information is not intended to replace advice given to you by your health care provider. Make sure you discuss any questions you have with your health care provider. Document Released: 02/13/2001 Document Revised: 09/12/2015 Document Reviewed: 03/31/2014 Elsevier Interactive Patient Education  2017 [place]  Consider seeing [place] (podiatrist) if not improving.

## 2016-04-24 LAB — VITAMIN D 25 HYDROXY (VIT D DEFICIENCY, FRACTURES): Vit D, 25-Hydroxy: 30 ng/mL (ref 30–100)

## 2016-05-22 ENCOUNTER — Telehealth: Payer: Self-pay | Admitting: Family Medicine

## 2016-05-22 NOTE — Telephone Encounter (Signed)
Faxed insurance verification for [employer]

## 2016-05-24 DIAGNOSIS — N6001 Solitary cyst of right breast: Secondary | ICD-10-CM | POA: Diagnosis not present

## 2016-05-24 DIAGNOSIS — N6310 Unspecified lump in the right breast, unspecified quadrant: Secondary | ICD-10-CM | POA: Diagnosis not present

## 2016-05-24 LAB — HM MAMMOGRAPHY: HM Mammogram: ABNORMAL — AB (ref 0–4)

## 2016-06-12 NOTE — Telephone Encounter (Signed)
Called pt and verified her insurance is still the same for this year.  Unfortunately all verifications for 2017 to [facility] for Prolia are no longer valid for 2018, so I called [employer] # [id number] and initiated reverification of benefits for Prolia and we will receive a fax of benefits.

## 2016-06-15 DIAGNOSIS — E890 Postprocedural hypothyroidism: Secondary | ICD-10-CM | POA: Diagnosis not present

## 2016-06-22 DIAGNOSIS — E890 Postprocedural hypothyroidism: Secondary | ICD-10-CM | POA: Diagnosis not present

## 2016-06-22 DIAGNOSIS — C73 Malignant neoplasm of thyroid gland: Secondary | ICD-10-CM | POA: Diagnosis not present

## 2016-07-04 ENCOUNTER — Encounter: Payer: Self-pay | Admitting: Family Medicine

## 2016-07-04 ENCOUNTER — Telehealth: Payer: Self-pay | Admitting: Family Medicine

## 2016-07-04 NOTE — Telephone Encounter (Signed)
Recv'd Summary of Benefits for Prolia & went over with Rep Merry Proud, pt has to pay 20% plus $10 admin fee so approximately $230.  Left message for pt

## 2016-07-09 NOTE — Telephone Encounter (Signed)
Pt called I  & gave her estimated cost of Prolia, she will think about it and call back and let us know if she wants to start this injection

## 2016-08-06 NOTE — Telephone Encounter (Signed)
Pt called I  & gave her estimated cost of Prolia, she will think about it and call back and let us know if she wants to start this injection     July 04, 2016  Me    9:54 AM  Note    Recv'd Summary of Benefits for Prolia & went over with Rep Merry Proud, pt has to pay 20% plus $10 admin fee so approximately $230.  Left message for pt

## 2016-09-04 ENCOUNTER — Other Ambulatory Visit: Payer: Self-pay | Admitting: Family Medicine

## 2016-09-14 ENCOUNTER — Telehealth: Payer: Self-pay | Admitting: Family Medicine

## 2016-09-14 NOTE — Telephone Encounter (Signed)
Left message for pt to call.   Rita Ohara, MD  Willy Eddy        I was not aware. Per last phone conversation 07/04/16, Mickel Baas stated she was going to let pt think about the price. Please call the patient and remind her that we haven't heard from her and to let us know if she is interested. I highly recommend it. She did not tolerate bisphosphonates. Not sure if we discussed alternatives such as Evista (raloxifene) in the past.   Previous Messages    ----- Message -----  From: Willy Eddy  Sent: 09/13/2016 11:47 AM  To: Rita Ohara, MD  Subject: prolia patient                  As I'm sure you are already aware, I have taken over Prolia. In the process of setting everything up I came across this pt. Apparently this pt never came in to receive this injection. I wanted to make sure you were aware.   Wendelyn Breslow

## 2016-09-19 NOTE — Telephone Encounter (Signed)
Other option would be Evista--I'm not sure if we ever discussed this or not, so OV would be recommended to discuss potential risks/side effects and for prescription, if she decides to try that instead.

## 2016-09-19 NOTE — Telephone Encounter (Signed)
Finally able to get in touch with pt. She states that she is concerned about the cost of prolia. She would like to know what other options she has. Pt can be reached at [phone].

## 2016-09-19 NOTE — Telephone Encounter (Signed)
Patient scheduled for 10/03/16.

## 2016-10-02 NOTE — Progress Notes (Signed)
Chief Complaint  Patient presents with  . Advice Only    discuss Evista.     Patient presents to discuss osteoporosis treatment options.  She did not tolerate bisphosphonate. She is concerned about the cost of Prolia (has been given 2 different quoted amounts from Mickel Baas and Brennan Bailey was $380, other was $240--willing to pay the latter, not the former). We have not previously discussed Evista as a potential treatment.  She had DEXA 05/2015 which showed osteoporosis in the spine, T-3.4. She had Reclast infusion on 07/05/15, followed by vomiting and diarrhea and severe aches in the joints (pain lasted up to 2 weeks). She has had low vitamin D in the last, last level was borderline at 30 in November.  She has no h/o PE, DVT, problems related to pregnancy or OCP's, no family h/o PE/DVT's, clotting disorders   PMH, PSH, SH reviewed  Outpatient Encounter Prescriptions as of 10/03/2016  Medication Sig Note  . cholecalciferol (VITAMIN D) 1000 units tablet Take 1,000 Units by mouth daily. 04/23/2016: Maybe 3 times per week  . DEXILANT 60 MG capsule TAKE 1 CAPSULE BY MOUTH DAILY.   Marland Kitchen levothyroxine (SYNTHROID) 112 MCG tablet Take 112 mcg by mouth daily before breakfast.    No facility-administered encounter medications on file as of 10/03/2016.    ROS: feeling well, no complaints or concerns today. Denies hot flashes, weight changes, leg swelling, chest pain.    PHYSICAL EXAM:  BP 110/70 (BP Location: Left Arm, Patient Position: Sitting, Cuff Size: Normal)   Pulse 68   Ht 5' 6.75" (1.695 m)   Wt 165 lb 3.2 oz (74.9 kg)   BMI 26.07 kg/m  Well appearing, pleasant female in no distress Remainder of exam limited to discussion/counseling.  ASSESSMENT/PLAN:  Osteoporosis, unspecified osteoporosis type, unspecified pathological fracture presence  Discussed risks/side effects of Evista in detail.  She prefers not to have to take a pill daily--prefers Prolia if affordable.  Will need to f/u  with Juliann Pulse to determine true cost so she can make her decision.  She will contact me for Evista rx if desired.   All questions answered.

## 2016-10-03 ENCOUNTER — Ambulatory Visit (INDEPENDENT_AMBULATORY_CARE_PROVIDER_SITE_OTHER): Payer: Medicare Other | Admitting: Family Medicine

## 2016-10-03 ENCOUNTER — Encounter: Payer: Self-pay | Admitting: Family Medicine

## 2016-10-03 VITALS — BP 110/70 | HR 68 | Ht 66.75 in | Wt 165.2 lb

## 2016-10-03 DIAGNOSIS — M810 Age-related osteoporosis without current pathological fracture: Secondary | ICD-10-CM

## 2016-10-03 NOTE — Patient Instructions (Signed)
Raloxifene tablets What is this medicine? RALOXIFENE (ral OX i feen) reduces the amount of calcium lost from bones. It is used to treat and prevent osteoporosis in women who have experienced menopause. It may also help prevent invasive breast cancer in certain women who have a high risk for breast cancer. This medicine may be used for other purposes; ask your health care provider or pharmacist if you have questions. COMMON BRAND NAME(S): Evista What should I tell my health care provider before I take this medicine? They need to know if you have any of these conditions: -a history of blood clots -cancer -heart disease or recent heart attack -high levels of triglycerides (blood fat) in the blood -history of stroke -kidney disease -liver disease -premenopausal -smoke tobacco -an unusual or allergic reaction to raloxifene, other medicines, foods, dyes, or preservatives -pregnant or trying to get pregnant -breast-feeding How should I use this medicine? Take this medicine by mouth with a glass of water. Follow the directions on the prescription label. The tablets can be taken with or without food. Take your doses at regular intervals. Do not take your medicine more often than directed. A special MedGuide will be given to you by the pharmacist with each prescription and refill. Be sure to read this information carefully each time. Talk to your pediatrician regarding the use of this medicine in children. Special care may be needed. Overdosage: If you think you have taken too much of this medicine contact a poison control center or emergency room at once. NOTE: This medicine is only for you. Do not share this medicine with others. What if I miss a dose? If you miss a dose, take it as soon as you can. If it is almost time for your next dose, take only that dose. Do not take double or extra doses. What may interact with this medicine? -cholestyramine -female hormones, like  estrogens -warfarin This list may not describe all possible interactions. Give your health care provider a list of all the medicines, herbs, non-prescription drugs, or dietary supplements you use. Also tell them if you smoke, drink alcohol, or use illegal drugs. Some items may interact with your medicine. What should I watch for while using this medicine? Visit your doctor or health care professional for regular checks on your progress. Do not stop taking this medicine except on the advice of your doctor or health care professional. If you are taking this medicine to reduce your risk of getting breast cancer, you should know that this medicine does not prevent all types of breast cancer. Talk to your doctor if you have questions. This medicine does not prevent hot flashes. It may cause hot flashes in some patients at the start of therapy. You should make sure that you get enough calcium and vitamin D while you are taking this medicine. Discuss the foods you eat and the vitamins you take with your health care professional. Exercise may help to prevent bone loss. Discuss your exercise needs with your doctor or health care professional. This medicine can rarely cause blood clots. If you are going to have surgery, tell your doctor or health care professional that you are taking this medicine. This medicine should be stopped at least 3 days before surgery. After surgery, it should be restarted only after you are walking again. It should not be restarted while you still need long periods of bed rest. You should not smoke while taking this medicine. Smoking may increase your risk of blood clots or stroke.  If you have any reason to think you are pregnant; stop taking this medicine at once and contact your doctor or health care professional. Do not breast feed while taking this medicine. What side effects may I notice from receiving this medicine? Side effects that you should report to your doctor or health  care professional as soon as possible: -allergic reactions like skin rash, itching or hives, swelling of the face, lips, or tongue) -breast tissue changes or discharge -signs and symptoms of a blood clot such as breathing problems; changes in vision; chest pain; severe, sudden headache; pain, swelling, warmth in the leg; trouble speaking; sudden numbness or weakness of the face, arm or leg -signs and symptoms of a stroke like changes in vision; confusion; trouble speaking or understanding; severe headaches; sudden numbness or weakness of the face, arm or leg; trouble walking; dizziness; loss of balance or coordination -vaginal discharge that is bloody, brown, or rust Side effects that usually do not require medical attention (report to your doctor or health care professional if they continue or are bothersome): -hot flashes -joint pain -leg cramps -sweating -swelling of the ankles, feet, hands This list may not describe all possible side effects. Call your doctor for medical advice about side effects. You may report side effects to FDA at 1-800-FDA-1088. Where should I keep my medicine? Keep out of the reach of children. Store at room temperature between 15 and 30 degrees C (59 and 86 degrees F). Throw away any unused medicine after the expiration date. NOTE: This sheet is a summary. It may not cover all possible information. If you have questions about this medicine, talk to your doctor, pharmacist, or health care provider.  2018 Elsevier/Gold Standard (2016-06-27 17:15:34)    Let me know if you are interested in a prescription for this, vs calling to start on the Prolia. Juliann Pulse is the contact for Prolia at our office.

## 2016-10-05 NOTE — Telephone Encounter (Signed)
Pt has chosen the option of Prolia. I called and spoke to rep Barbara Parker to verify cost. He confirmed that her out of pocket cost will be approximately $230.00. I called pt and appt was made for 10/18/2016. Will order medication from [name] when she returns on Monday.

## 2016-10-18 ENCOUNTER — Other Ambulatory Visit (INDEPENDENT_AMBULATORY_CARE_PROVIDER_SITE_OTHER): Payer: Medicare Other

## 2016-10-18 ENCOUNTER — Telehealth: Payer: Self-pay | Admitting: Family Medicine

## 2016-10-18 DIAGNOSIS — M810 Age-related osteoporosis without current pathological fracture: Secondary | ICD-10-CM | POA: Diagnosis not present

## 2016-10-18 MED ORDER — DENOSUMAB 60 MG/ML ~~LOC~~ SOLN
60.0000 mg | Freq: Once | SUBCUTANEOUS | Status: AC
Start: 2016-10-18 — End: 2016-10-18
  Administered 2016-10-18: 60 mg via SUBCUTANEOUS

## 2016-10-18 NOTE — Telephone Encounter (Signed)
PT received prolia injection

## 2016-11-20 ENCOUNTER — Encounter: Payer: Self-pay | Admitting: Family Medicine

## 2016-11-20 ENCOUNTER — Other Ambulatory Visit: Payer: Self-pay | Admitting: Family Medicine

## 2016-11-21 ENCOUNTER — Other Ambulatory Visit: Payer: Self-pay | Admitting: *Deleted

## 2016-11-21 MED ORDER — DEXLANSOPRAZOLE 60 MG PO CPDR: 1.0000 | DELAYED_RELEASE_CAPSULE | Freq: Every day | ORAL | 2 refills | Status: DC

## 2017-01-21 ENCOUNTER — Other Ambulatory Visit: Payer: Self-pay | Admitting: Family Medicine

## 2017-01-23 ENCOUNTER — Encounter: Payer: Self-pay | Admitting: Family Medicine

## 2017-01-23 ENCOUNTER — Telehealth: Payer: Self-pay | Admitting: Family Medicine

## 2017-01-23 NOTE — Telephone Encounter (Signed)
Message sent to patient. Thanks!

## 2017-01-23 NOTE — Telephone Encounter (Signed)
Sent Sympathy card for loss of husband.

## 2017-03-13 ENCOUNTER — Encounter: Payer: Self-pay | Admitting: Family Medicine

## 2017-03-13 NOTE — Telephone Encounter (Signed)
Please phone in the Zolpidem as pended (per her MyChart message)

## 2017-03-14 ENCOUNTER — Other Ambulatory Visit: Payer: Self-pay | Admitting: *Deleted

## 2017-03-14 MED ORDER — ZOLPIDEM TARTRATE 5 MG PO TABS: 5.0000 mg | ORAL_TABLET | Freq: Every evening | ORAL | 0 refills | Status: DC | PRN

## 2017-03-19 DIAGNOSIS — M25571 Pain in right ankle and joints of right foot: Secondary | ICD-10-CM | POA: Diagnosis not present

## 2017-03-20 ENCOUNTER — Other Ambulatory Visit: Payer: Self-pay | Admitting: Family Medicine

## 2017-04-10 ENCOUNTER — Telehealth: Payer: Self-pay | Admitting: Family Medicine

## 2017-04-10 NOTE — Telephone Encounter (Signed)
Pt called concerning Prolia shot. Insurance was verified and her estimated cost of $230.00 stayed the same. All papers copied and scanned. Pt already has an appt on 04/24/2017 and will receive at that time. Prolia will be order from [place].

## 2017-04-12 ENCOUNTER — Telehealth: Payer: Self-pay | Admitting: Family Medicine

## 2017-04-12 NOTE — Telephone Encounter (Signed)
Ok to refill #30. She has appt scheduled 11/21. We will discuss her regular use at that time (recently lost husband).

## 2017-04-12 NOTE — Telephone Encounter (Signed)
Recv'd fax from [facility] for refill Zolpidem 5 mg #30

## 2017-04-12 NOTE — Telephone Encounter (Signed)
Called into pharmacy

## 2017-04-16 ENCOUNTER — Encounter: Payer: Self-pay | Admitting: Family Medicine

## 2017-04-18 ENCOUNTER — Other Ambulatory Visit: Payer: Self-pay | Admitting: Family Medicine

## 2017-04-24 ENCOUNTER — Other Ambulatory Visit (INDEPENDENT_AMBULATORY_CARE_PROVIDER_SITE_OTHER): Payer: Medicare Other

## 2017-04-24 DIAGNOSIS — M810 Age-related osteoporosis without current pathological fracture: Secondary | ICD-10-CM | POA: Diagnosis not present

## 2017-04-24 MED ORDER — DENOSUMAB 60 MG/ML ~~LOC~~ SOLN
60.0000 mg | Freq: Once | SUBCUTANEOUS | Status: AC
Start: 2017-04-24 — End: 2017-04-24
  Administered 2017-04-24: 60 mg via SUBCUTANEOUS

## 2017-05-10 ENCOUNTER — Telehealth: Payer: Self-pay | Admitting: Family Medicine

## 2017-05-10 ENCOUNTER — Other Ambulatory Visit: Payer: Self-pay

## 2017-05-10 NOTE — Telephone Encounter (Signed)
See message below °

## 2017-05-10 NOTE — Telephone Encounter (Signed)
Rcvd refill request for Zolpidem Tartrate 5 mg #30

## 2017-05-10 NOTE — Telephone Encounter (Signed)
Called and spoke with pt she said that she didn't need a refill on the zolpidem 5 mg  Because she has not taking at all , she made an appt for 07/11/2016 cpe. Pt doesn't need a refill on meds.

## 2017-05-10 NOTE — Telephone Encounter (Signed)
Ok to refill just once.  She appears to be using this still quite frequently (probably started having trouble after losing her husband) She canceled her physical in November--she needs to be put on a waiting list (I don't know if she is, please check and make sure she is), and may need to come in for med check if scheduled out for a long time, and/or if needing frequent refills of this controlled substance.

## 2017-05-14 ENCOUNTER — Telehealth: Payer: Self-pay | Admitting: Family Medicine

## 2017-05-14 NOTE — Telephone Encounter (Signed)
[facility] request Zolpidem Tartrate 5 mg

## 2017-05-14 NOTE — Telephone Encounter (Signed)
Will do!

## 2017-05-14 NOTE — Telephone Encounter (Signed)
See prior message, where PATIENT stated she didn't need them.  Maybe we should let the pharmacy know, so they stop requesting from [place]!

## 2017-05-16 ENCOUNTER — Other Ambulatory Visit: Payer: Self-pay | Admitting: Family Medicine

## 2017-05-17 ENCOUNTER — Other Ambulatory Visit: Payer: Self-pay | Admitting: Family Medicine

## 2017-05-23 ENCOUNTER — Telehealth: Payer: Self-pay | Admitting: Family Medicine

## 2017-05-23 NOTE — Telephone Encounter (Signed)
[facility] advised pt does not want refill on Zolpidem Tartrate.

## 2017-05-24 DIAGNOSIS — M542 Cervicalgia: Secondary | ICD-10-CM | POA: Diagnosis not present

## 2017-05-30 DIAGNOSIS — Z1231 Encounter for screening mammogram for malignant neoplasm of breast: Secondary | ICD-10-CM | POA: Diagnosis not present

## 2017-05-30 LAB — HM MAMMOGRAPHY

## 2017-06-05 DIAGNOSIS — M542 Cervicalgia: Secondary | ICD-10-CM | POA: Diagnosis not present

## 2017-06-05 DIAGNOSIS — C73 Malignant neoplasm of thyroid gland: Secondary | ICD-10-CM | POA: Diagnosis not present

## 2017-06-05 DIAGNOSIS — E890 Postprocedural hypothyroidism: Secondary | ICD-10-CM | POA: Diagnosis not present

## 2017-06-06 DIAGNOSIS — E890 Postprocedural hypothyroidism: Secondary | ICD-10-CM | POA: Diagnosis not present

## 2017-06-06 DIAGNOSIS — N6001 Solitary cyst of right breast: Secondary | ICD-10-CM | POA: Diagnosis not present

## 2017-06-06 DIAGNOSIS — C73 Malignant neoplasm of thyroid gland: Secondary | ICD-10-CM | POA: Diagnosis not present

## 2017-06-06 LAB — HM MAMMOGRAPHY

## 2017-06-13 DIAGNOSIS — M542 Cervicalgia: Secondary | ICD-10-CM | POA: Diagnosis not present

## 2017-06-14 ENCOUNTER — Other Ambulatory Visit: Payer: Self-pay | Admitting: Medical

## 2017-06-15 ENCOUNTER — Other Ambulatory Visit: Payer: Self-pay | Admitting: Medical

## 2017-06-26 ENCOUNTER — Encounter: Payer: Self-pay | Admitting: *Deleted

## 2017-07-11 ENCOUNTER — Encounter: Payer: Self-pay | Admitting: Family Medicine

## 2017-07-13 ENCOUNTER — Other Ambulatory Visit: Payer: Self-pay | Admitting: Family Medicine

## 2017-08-13 DIAGNOSIS — L820 Inflamed seborrheic keratosis: Secondary | ICD-10-CM | POA: Diagnosis not present

## 2017-08-20 ENCOUNTER — Encounter: Payer: Self-pay | Admitting: Family Medicine

## 2017-08-20 NOTE — Progress Notes (Signed)
Chief Complaint  Patient presents with  . Medicare Wellness    nonfasting annual exam with pelvic. Sees Dr [place] for eye exam and prefers to do there. Dr Chalmers Cater told her to ask you about having labs drawn for allergies.     Barbara Parker is a 72 y.o. female who presents for annual physical, Medicare wellness visit and follow-up on chronic medical conditions.  She has the following concerns:  She lost her husband back in August.  Doing well. She has made new living will and healthcare power of attorney--needs to get [place] copies.  She has been having some problems with loose stools, gassiness, bloating. Not as bad in the last few weeks when she has been completely dairy-free. Doesn't notice problems when eating bread/gluten. She has h/o diarrhea, and has seen Dr. Collene Mares in the past.  She tried a few medicines in the past with Dr. Collene Mares, but it didn't really make a difference. She also tried Lactaid, eliminating milk, but also didn't help. Only occurs after eating. Describes it as food "going right through her".  She feels bloated all the time, not just related to eating. No weight loss.  Probiotics were recommended last year. She took them until they ran out, can't recall if it helped. Dr. Chalmers Cater mentioned getting blood tests for food allergies through our office, and she would like to have those done.  She finds that what she has now isn't as severe as it was when she saw Dr. Collene Mares. Bowels are daily, soft.  No longer as foul smelling as they were last year. Denies blood or mucus in stool.  Osteoporosis:  She has been getting Prolia injections (10/2016, 04/2017). She had DEXA 12/2016which showed osteoporosis in the spine, T-3.4. She had Reclast infusion on 07/05/15, followed by vomiting and diarrhea and severe aches in the joints (pain lasted up to 2 weeks). She has had low vitamin D in the last, last level was borderline at 30 in November 2017.  Hypothyroidism: She is under the care of Dr. Chalmers Cater. TSH  2.860 in January 2019 at Dr. Almetta Lovely office. Dose has remained stable at 112 mcg. Denies hair or skin changes (slightly dry skin, unchanged).  GERD: She has been taking the Dexilant daily recently.  She used to manage with taking it about every other day. She was getting recurrent reflux on the days she didn't take it. Denies change in diet. Denies dysphagia.  Shingles/Postherpetic neuralgia:  She denies any pain; does have some residual itching over her right eyebrow, and decreased hearing in the right ear.  She saw audiologist at Dr. Janeice Robinson office, and now has a hearing aid (for both ears). She is asking about shingles vaccine.  Urinary urge incontinence--Isn't as bothersome as in the past, as long as she remembers to void more frequently (which doesn't always happen when she is very busy). Denies leakage with cough/sneeze.  Denies dysuria, hematuria.   Immunization History  Administered Date(s) Administered  . Influenza Split 04/09/2011, 03/25/2012  . Influenza, High Dose Seasonal PF 03/16/2013, 03/29/2015, 02/22/2016  . Pneumococcal Conjugate-13 03/16/2013  . Pneumococcal Polysaccharide-23 04/20/2015  . Tdap 04/09/2011  . Zoster 05/15/2011   Last Pap smear: 2004; s/p hysterectomy (for fibroids) Last mammogram: 05/2017; had [place] on R 06/2017 (showed cyst at 11 o'clock, R breast) Last colonoscopy: 04/2012 Dr. Collene Mares  Last DEXA: 05/2015 at [facility], T-3.4 at spine Dentist: twice yearly  Ophtho: yearly Exercise: Exercise ball and weights in her bedroom every night. Walks when the weather  is nice (daily if able) 30-45 minutes.   Lipids: Lab Results  Component Value Date   CHOL 211 (H) 04/20/2015   HDL 96 04/20/2015   LDLCALC 102 04/20/2015   TRIG 63 04/20/2015   CHOLHDL 2.2 04/20/2015   Other doctors caring for patient include: Ophtho: Dr. [place] Dentist: Dr. Philipp Ovens (retired, hasn't seen new doctor yet) GI: Dr. Collene Mares Endocrinologist: Dr. Chalmers Cater Ortho: Dr. Maxie Better (stress  fracture R foot after fall 11/2014) ENT: Dr. Constance Holster (for broken nose from fall 11/2014, hearing loss) Derm: Dr. Renda Rolls   Depression screen: Negative  Fall screen: None in the last year  Functional Status Survey: Notable for hearing loss (has hearing aids now), some leakage of urine Mini-Cog screen: 5 (normal) See full screens in Epic  End of Life Discussion: Patient hasa living will and medical power of attorney  Past Medical History:  Diagnosis Date  . GERD (gastroesophageal reflux disease)   . Hyperthyroidism   . Hypothyroidism (acquired) 12/2005   history of thyroid cancer and thyroidectomy; Dr. Chalmers Cater  . Internal hemorrhoids 06/30/2002   Dr.Mann  . Osteoporosis    not on treatment  . Shingles 06/2013   affecting R eye and scalp  . Thyroid cancer ([place]) 2007    Past Surgical History:  Procedure Laterality Date  . ABDOMINAL HYSTERECTOMY  1987   still has ovaries. (benign reasons)  . THYROIDECTOMY  12/2005    Social History   Socioeconomic History  . Marital status: Married    Spouse name: Not on file  . Number of children: 2  . Years of education: Not on file  . Highest education level: Not on file  Social Needs  . Financial resource strain: Not on file  . Food insecurity - worry: Not on file  . Food insecurity - inability: Not on file  . Transportation needs - medical: Not on file  . Transportation needs - non-medical: Not on file  Occupational History  . 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 Smoking status: Never Smoker  . Smokeless tobacco: Never Used  Substance and Sexual Activity  . Alcohol use: Yes    Comment: glass or two of wine maybe twice a year.  . Drug use: No  . Sexual activity: No    Partners: Male  Other Topics Concern  . Not on file  Social History Narrative   Widowed 01/2017. Son in [place], Daughter in [place]. 6 grandkids.   Works part time at her church  Financial trader)    Family History  Problem Relation Age of Onset  . Dementia Mother   . Hypothyroidism Daughter   . Breast cancer Maternal Grandmother 72          . Ovarian cancer Paternal Grandmother        and pancreatic  . Diabetes Neg Hx     Outpatient Encounter Medications as of 08/21/2017  Medication Sig Note  . cholecalciferol (VITAMIN D) 1000 units tablet Take 1,000 Units by mouth daily.   Marland Kitchen dexlansoprazole (DEXILANT) 60 MG capsule Take 1 capsule (60 mg total) by mouth daily.   Marland Kitchen levothyroxine (SYNTHROID) 112 MCG tablet Take 112 mcg by mouth daily before breakfast.   . zolpidem (AMBIEN) 5 MG tablet Take 1 tablet (5 mg total) by mouth at bedtime as needed for sleep. (Patient not taking: Reported on 08/21/2017) 08/21/2017: Hasn't needed any in 2 months. Uses just prn  . [DISCONTINUED] DEXILANT 60 MG  capsule TAKE 1 CAPSULE BY MOUTH DAILY.   . [DISCONTINUED] DEXILANT 60 MG capsule TAKE 1 CAPSULE BY MOUTH DAILY   . [DISCONTINUED] DEXILANT 60 MG capsule TAKE 1 CAPSULE BY MOUTH DAILY    No facility-administered encounter medications on file as of 08/21/2017.     Allergies  Allergen Reactions  . Codeine Anaphylaxis  . Reclast [Zoledronic Acid] Diarrhea and Nausea And Vomiting    Joint pain  . Prednisone     Nausea and vomiting    ROS: The patient denies anorexia, fever, vision changes, ear pain, sore throat, breast concerns, chest pain, palpitations, dizziness, syncope, dyspnea on exertion, cough, swelling, nausea, vomiting, constipation, abdominal pain, melena, hematochezia, indigestion/heartburn (controlled with Dexilant), hematuria, dysuria, vaginal bleeding, discharge, odor or itch, genital lesions, joint pains, numbness, tingling, weakness, tremor, suspicious skin lesions, depression, anxiety, abnormal bleeding/bruising, or enlarged lymph nodes.  +rare tingling in feet or hands, depending on position (when sleeping), has gotten better. Recently had some neck pain/tingling, saw  ortho (PA), who put her on prednisone and got physical therapy. Tolerated the prednisone better than in the past (prev significant nausea). Has been doing home regimen. Chronically bruises easily, unchanged. urinary urgency and some urge incontinence as per HPI. +gas, some bloating, and diarrhea after eating as per HPI\ decreased hearing in R>L ears--now has bilateral hearing aids. Mole removed last week by derm.   PHYSICAL EXAM:  BP 110/70   Pulse 60   Ht 5' 5.75" (1.67 m)   Wt 162 lb 9.6 oz (73.8 kg)   BMI 26.44 kg/m   Wt Readings from Last 3 Encounters:  08/21/17 162 lb 9.6 oz (73.8 kg)  10/03/16 165 lb 3.2 oz (74.9 kg)  04/23/16 160 lb 12.8 oz (72.9 kg)     General Appearance:  Alert, cooperative, no distress, appears stated age   Head:  Normocephalic, without obvious abnormality, atraumatic   Eyes:  PERRL, conjunctiva/corneas clear, EOM's intact, fundi benign.   Ears:  Normal TM's and external ear canals   Nose:  Nares normal, mucosa mildly edematous, no drainage or sinus tenderness   Throat:  Lips, mucosa, and tongue normal; teeth and gums normal   Neck:  Supple, no lymphadenopathy; thyroid: no enlargement/ tenderness/nodules; no carotid bruit or JVD   Back:  Spine nontender, no curvature, ROM normal, no CVA tenderness   Lungs:  Clear to auscultation bilaterally without wheezes, rales or ronchi; respirations unlabored   Chest Wall:  No tenderness or deformity   Heart:  Regular rate and rhythm, S1 and S2 normal, no murmur, rub or gallop   Breast Exam:  No tenderness, masses, or nipple discharge or inversion. No axillary lymphadenopathy   Abdomen:  Soft, non-tender, nondistended, active bowel sounds, no masses, no hepatosplenomegaly   Genitalia:  Normal external genitalia without lesions. +atrophic changes noted at introitus. BUS and vagina normal; Uterus absent; and adnexa not enlarged, not palpable, nontender, no masses. Pap not performed    Rectal:  Normal tone, no masses or tenderness; heme negative, soft light brown stool  Extremities:  No clubbing, cyanosis or edema.   Pulses:  2+ and symmetric all extremities   Skin:  Skin color, texture, turgor normal, no rashes or lesions. Scab at left posterior shoulder (recently treated by derm)  Lymph nodes:  Cervical, supraclavicular, and axillary nodes normal   Neurologic:  CNII-XII intact, normal strength, sensation and gait; reflexes 2+ and symmetric throughout   Psych: Normal mood, affect, hygiene and grooming   ASSESSMENT/PLAN:   Annual  physical exam - Plan: POCT Urinalysis DIP (Proadvantage Device), Lipid panel, VITAMIN D 25 Hydroxy (Vit-D Deficiency, Fractures), CBC with Differential/Platelet, Comprehensive metabolic panel  Medicare annual wellness visit, subsequent  Osteoporosis, unspecified osteoporosis type, unspecified pathological fracture presence - due for f/u DEXA at [facility]. Cont q6 mo Prolia. Disc Ca, D, weight-bearing exercise - Plan: VITAMIN D 25 Hydroxy (Vit-D Deficiency, Fractures)  Hypothyroidism, acquired - recent normal TSH, monitored by Dr. Chalmers Cater  Vitamin D deficiency - Plan: VITAMIN D 25 Hydroxy (Vit-D Deficiency, Fractures)  Gastroesophageal reflux disease, esophagitis presence not specified - recently requiring daily PPI. Discussed diet, using lowest effective dose--consider change to Omep or esomep 40 vs Dexilant (prev at qod) - Plan: dexlansoprazole (DEXILANT) 60 MG capsule  Medication monitoring encounter - Plan: VITAMIN D 25 Hydroxy (Vit-D Deficiency, Fractures), CBC with Differential/Platelet, Comprehensive metabolic panel  Postprandial abdominal bloating - improved since dairy-free. Restart probiotic; food diet. Consider Beano prior to meals which trigger.  Check for allergies - Plan: CBC with Differential/Platelet, Comprehensive metabolic panel, Food Allergy Profile, Celiac Panel   Cbc, c-met, Vit D    Discussed  monthly self breast exams and yearly mammograms; at least 30 minutes of aerobic activity at least 5 days/week, weight bearing exercise at least 2x/wk; proper sunscreen use reviewed; healthy diet, including goals of calcium and vitamin D intake and alcohol recommendations (less than or equal to 1 drink/day) reviewed; regular seatbelt use; changing batteries in smoke detectors. Immunization recommendations discussed--UTD; consider Shingrix when available, from pharmacy; risks/SE reviewed. Colonoscopy recommendations reviewed--UTD. DEXA due--[facility]   She is Full Code, Full Care. MOST form reviewed and updated.   Restart a daily probiotic. Keep food diary to see which foods may trigger. Consider trying Beano before eating.   Medicare Attestation I have personally reviewed: The patient's medical and social history Their use of alcohol, tobacco or illicit drugs Their current medications and supplements The patient's functional ability including ADLs,fall risks, home safety risks, cognitive, and hearing and visual impairment Diet and physical activities Evidence for depression or mood disorders  The patient's weight, height and BMI have been recorded in the chart.  I have made referrals, counseling, and provided education to the patient based on review of the above and I have provided the patient with a written personalized care plan for preventive services.

## 2017-08-21 ENCOUNTER — Encounter: Payer: Self-pay | Admitting: Family Medicine

## 2017-08-21 ENCOUNTER — Ambulatory Visit: Payer: Medicare Other | Admitting: Family Medicine

## 2017-08-21 VITALS — BP 110/70 | HR 60 | Ht 65.75 in | Wt 162.6 lb

## 2017-08-21 DIAGNOSIS — K219 Gastro-esophageal reflux disease without esophagitis: Secondary | ICD-10-CM | POA: Diagnosis not present

## 2017-08-21 DIAGNOSIS — Z5181 Encounter for therapeutic drug level monitoring: Secondary | ICD-10-CM

## 2017-08-21 DIAGNOSIS — E039 Hypothyroidism, unspecified: Secondary | ICD-10-CM

## 2017-08-21 DIAGNOSIS — Z0000 Encounter for general adult medical examination without abnormal findings: Secondary | ICD-10-CM

## 2017-08-21 DIAGNOSIS — R140 Abdominal distension (gaseous): Secondary | ICD-10-CM | POA: Diagnosis not present

## 2017-08-21 DIAGNOSIS — E559 Vitamin D deficiency, unspecified: Secondary | ICD-10-CM

## 2017-08-21 DIAGNOSIS — M810 Age-related osteoporosis without current pathological fracture: Secondary | ICD-10-CM | POA: Diagnosis not present

## 2017-08-21 LAB — POCT URINALYSIS DIP (PROADVANTAGE DEVICE)
BILIRUBIN UA: NEGATIVE mg/dL
Bilirubin, UA: NEGATIVE
Blood, UA: NEGATIVE
Glucose, UA: NEGATIVE mg/dL
Leukocytes, UA: NEGATIVE
Nitrite, UA: NEGATIVE
PH UA: 6 (ref 5.0–8.0)
Protein Ur, POC: NEGATIVE mg/dL
SPECIFIC GRAVITY, URINE: 1.025
Urobilinogen, Ur: NEGATIVE

## 2017-08-21 MED ORDER — DEXLANSOPRAZOLE 60 MG PO CPDR: 1.0000 | DELAYED_RELEASE_CAPSULE | Freq: Every day | ORAL | 5 refills | Status: DC

## 2017-08-21 NOTE — Patient Instructions (Addendum)
  HEALTH MAINTENANCE RECOMMENDATIONS:  It is recommended that you get at least 30 minutes of aerobic exercise at least 5 days/week (for weight loss, you may need as much as 60-90 minutes). This can be any activity that gets your heart rate up. This can be divided in 10-15 minute intervals if needed, but try and build up your endurance at least once a week.  Weight bearing exercise is also recommended twice weekly.  Eat a healthy diet with lots of vegetables, fruits and fiber.  "Colorful" foods have a lot of vitamins (ie green vegetables, tomatoes, red peppers, etc).  Limit sweet tea, regular sodas and alcoholic beverages, all of which has a lot of calories and sugar.  Up to 1 alcoholic drink daily may be beneficial for women (unless trying to lose weight, watch sugars).  Drink a lot of water.  Calcium recommendations are 1200-1500 mg daily (1500 mg for postmenopausal women or women without ovaries), and vitamin D 1000 IU daily.  This should be obtained from diet and/or supplements (vitamins), and calcium should not be taken all at once, but in divided doses.  Monthly self breast exams and yearly mammograms for women over the age of 65 is recommended.  Sunscreen of at least SPF 30 should be used on all sun-exposed parts of the skin when outside between the hours of 10 am and 4 pm (not just when at beach or pool, but even with exercise, golf, tennis, and yard work!)  Use a sunscreen that says "broad spectrum" so it covers both UVA and UVB rays, and make sure to reapply every 1-2 hours.  Remember to change the batteries in your smoke detectors when changing your clock times in the spring and fall.  Use your seat belt every time you are in a car, and please drive safely and not be distracted with cell phones and texting while driving.    Barbara Parker , Thank you for taking time to come for your Medicare Wellness Visit. I appreciate your ongoing commitment to your health goals. Please review the  following plan we discussed and let me know if I can assist you in the future.   These are the goals we discussed: Goals    None      This is a list of the screening recommended for you and due dates:  Health Maintenance  Topic Date Due  . Flu Shot  01/02/2017  . Mammogram  06/07/2019  . Tetanus Vaccine  04/08/2021  . Colon Cancer Screening  04/11/2022  . DEXA scan (bone density measurement)  Completed  .  Hepatitis C: One time screening is recommended by Center for Disease Control  (CDC) for  adults born from [birth year] through 1963.   Completed  . Pneumonia vaccines  Completed   Continue yearly high dose flu shots. Continue YEARLY mammograms (next due 06/2018, not 2021 as stated above). You are due for a follow-up bone density test.  Call [facility] (and have them send me the order to sign via fax).  I recommend getting the new shingles vaccine (Shingrix). You will need to check with your insurance to see if it is covered, and if covered by Medicare Part D, you need to get from the pharmacy rather than our office.  It is a series of 2 injections, spaced 2 months apart.   Restart a daily probiotic. Keep food diary to see which foods may trigger your bloating. Consider trying Beano before eating.

## 2017-08-23 ENCOUNTER — Other Ambulatory Visit: Payer: Medicare Other

## 2017-08-26 ENCOUNTER — Other Ambulatory Visit: Payer: Medicare Other

## 2017-08-26 DIAGNOSIS — R140 Abdominal distension (gaseous): Secondary | ICD-10-CM | POA: Diagnosis not present

## 2017-08-26 DIAGNOSIS — Z0000 Encounter for general adult medical examination without abnormal findings: Secondary | ICD-10-CM | POA: Diagnosis not present

## 2017-08-26 DIAGNOSIS — M810 Age-related osteoporosis without current pathological fracture: Secondary | ICD-10-CM | POA: Diagnosis not present

## 2017-08-26 DIAGNOSIS — Z5181 Encounter for therapeutic drug level monitoring: Secondary | ICD-10-CM | POA: Diagnosis not present

## 2017-08-26 DIAGNOSIS — E559 Vitamin D deficiency, unspecified: Secondary | ICD-10-CM | POA: Diagnosis not present

## 2017-08-27 ENCOUNTER — Other Ambulatory Visit: Payer: Self-pay | Admitting: Family Medicine

## 2017-08-27 DIAGNOSIS — E559 Vitamin D deficiency, unspecified: Secondary | ICD-10-CM

## 2017-08-27 MED ORDER — VITAMIN D (ERGOCALCIFEROL) 1.25 MG (50000 UNIT) PO CAPS: 50000.0000 [IU] | ORAL_CAPSULE | ORAL | 0 refills | Status: DC

## 2017-08-30 LAB — CBC WITH DIFFERENTIAL/PLATELET
BASOS ABS: 0 10*3/uL (ref 0.0–0.2)
Basos: 1 %
EOS (ABSOLUTE): 0.1 10*3/uL (ref 0.0–0.4)
Eos: 2 %
Hematocrit: 38.6 % (ref 34.0–46.6)
Hemoglobin: 13.1 g/dL (ref 11.1–15.9)
IMMATURE GRANS (ABS): 0 10*3/uL (ref 0.0–0.1)
IMMATURE GRANULOCYTES: 0 %
LYMPHS: 21 %
Lymphocytes Absolute: 1.2 10*3/uL (ref 0.7–3.1)
MCH: 29.5 pg (ref 26.6–33.0)
MCHC: 33.9 g/dL (ref 31.5–35.7)
MCV: 87 fL (ref 79–97)
MONOCYTES: 9 %
Monocytes Absolute: 0.5 10*3/uL (ref 0.1–0.9)
NEUTROS PCT: 67 %
Neutrophils Absolute: 3.8 10*3/uL (ref 1.4–7.0)
PLATELETS: 297 10*3/uL (ref 150–379)
RBC: 4.44 x10E6/uL (ref 3.77–5.28)
RDW: 15 % (ref 12.3–15.4)
WBC: 5.6 10*3/uL (ref 3.4–10.8)

## 2017-08-30 LAB — COMPREHENSIVE METABOLIC PANEL
ALT: 27 IU/L (ref 0–32)
AST: 28 IU/L (ref 0–40)
Albumin/Globulin Ratio: 1.6 (ref 1.2–2.2)
Albumin: 4.1 g/dL (ref 3.6–4.8)
Alkaline Phosphatase: 121 IU/L — ABNORMAL HIGH (ref 39–117)
BUN/Creatinine Ratio: 25 (ref 12–28)
BUN: 19 mg/dL (ref 8–27)
Bilirubin Total: 0.3 mg/dL (ref 0.0–1.2)
CALCIUM: 9 mg/dL (ref 8.7–10.3)
CO2: 23 mmol/L (ref 20–29)
CREATININE: 0.76 mg/dL (ref 0.57–1.00)
Chloride: 105 mmol/L (ref 96–106)
GFR, EST AFRICAN AMERICAN: 93 mL/min/{1.73_m2} (ref >59)
GFR, EST NON AFRICAN AMERICAN: 80 mL/min/{1.73_m2} (ref >59)
GLUCOSE: 96 mg/dL (ref 65–99)
Globulin, Total: 2.5 g/dL (ref 1.5–4.5)
Potassium: 4.7 mmol/L (ref 3.5–5.2)
Sodium: 143 mmol/L (ref 134–144)
TOTAL PROTEIN: 6.6 g/dL (ref 6.0–8.5)

## 2017-08-30 LAB — FOOD ALLERGY PROFILE
Clam IgE: <0.1 kU/L
Codfish IgE: <0.1 kU/L
Milk IgE: <0.1 kU/L
Scallop IgE: <0.1 kU/L
Soybean IgE: <0.1 kU/L
Walnut IgE: <0.1 kU/L

## 2017-08-30 LAB — LIPID PANEL
Chol/HDL Ratio: 2.6 ratio (ref 0.0–4.4)
Cholesterol, Total: 220 mg/dL — ABNORMAL HIGH (ref 100–199)
HDL: 85 mg/dL (ref >39)
LDL CALC: 121 mg/dL — AB (ref 0–99)
Triglycerides: 68 mg/dL (ref 0–149)
VLDL Cholesterol Cal: 14 mg/dL (ref 5–40)

## 2017-08-30 LAB — GLIA (IGA/G) + TTG IGA
ANTIGLIADIN ABS, IGA: 5 U (ref 0–19)
GLIADIN IGG: 3 U (ref 0–19)
Transglutaminase IgA: <2 U/mL (ref 0–3)

## 2017-08-30 LAB — VITAMIN D 25 HYDROXY (VIT D DEFICIENCY, FRACTURES): VIT D 25 HYDROXY: 22.2 ng/mL — AB (ref 30.0–100.0)

## 2017-09-16 ENCOUNTER — Telehealth: Payer: Self-pay | Admitting: Family Medicine

## 2017-09-16 NOTE — Telephone Encounter (Signed)
Left message for pt to call. Need to verify no insurance changes for prolia.

## 2017-10-03 ENCOUNTER — Telehealth: Payer: Self-pay | Admitting: Family Medicine

## 2017-10-03 NOTE — Telephone Encounter (Signed)
Left message for pt to call concerning her next prolia vaccine. I need to speak to her.

## 2017-10-08 ENCOUNTER — Telehealth: Payer: Self-pay | Admitting: Family Medicine

## 2017-10-08 NOTE — Telephone Encounter (Signed)
Left another message for pt to call. Needs to make an appt for prolia after 10/22/2017. Her estimated out of pocket cost will be $245.00. Please advise Juliann Pulse once appt is made.

## 2017-10-18 DIAGNOSIS — H1045 Other chronic allergic conjunctivitis: Secondary | ICD-10-CM | POA: Diagnosis not present

## 2017-10-19 ENCOUNTER — Other Ambulatory Visit: Payer: Self-pay | Admitting: Family Medicine

## 2017-10-19 DIAGNOSIS — E559 Vitamin D deficiency, unspecified: Secondary | ICD-10-CM

## 2017-10-22 DIAGNOSIS — H47321 Drusen of optic disc, right eye: Secondary | ICD-10-CM | POA: Diagnosis not present

## 2017-10-22 DIAGNOSIS — H5347 Heteronymous bilateral field defects: Secondary | ICD-10-CM | POA: Diagnosis not present

## 2017-11-07 ENCOUNTER — Other Ambulatory Visit: Payer: Medicare Other

## 2017-11-18 ENCOUNTER — Other Ambulatory Visit (INDEPENDENT_AMBULATORY_CARE_PROVIDER_SITE_OTHER): Payer: Medicare Other

## 2017-11-18 DIAGNOSIS — M810 Age-related osteoporosis without current pathological fracture: Secondary | ICD-10-CM | POA: Diagnosis not present

## 2017-11-18 MED ORDER — DENOSUMAB 60 MG/ML ~~LOC~~ SOSY
60.0000 mg | PREFILLED_SYRINGE | Freq: Once | SUBCUTANEOUS | Status: AC
  Administered 2017-11-18: 60 mg via SUBCUTANEOUS

## 2018-01-24 ENCOUNTER — Ambulatory Visit (INDEPENDENT_AMBULATORY_CARE_PROVIDER_SITE_OTHER): Payer: Medicare Other

## 2018-01-24 ENCOUNTER — Ambulatory Visit: Payer: Medicare Other | Admitting: Podiatry

## 2018-01-24 ENCOUNTER — Encounter: Payer: Self-pay | Admitting: Podiatry

## 2018-01-24 ENCOUNTER — Other Ambulatory Visit: Payer: Self-pay | Admitting: Podiatry

## 2018-01-24 VITALS — BP 101/64 | HR 62 | Resp 16

## 2018-01-24 DIAGNOSIS — M7751 Other enthesopathy of right foot: Secondary | ICD-10-CM | POA: Diagnosis not present

## 2018-01-24 DIAGNOSIS — M25571 Pain in right ankle and joints of right foot: Secondary | ICD-10-CM

## 2018-01-24 DIAGNOSIS — D3610 Benign neoplasm of peripheral nerves and autonomic nervous system, unspecified: Secondary | ICD-10-CM

## 2018-01-24 DIAGNOSIS — M779 Enthesopathy, unspecified: Secondary | ICD-10-CM

## 2018-01-24 NOTE — Progress Notes (Signed)
   Subjective:    Patient ID: Barbara Parker, female    DOB: [birth year], 72 y.o.   MRN: [id number]  HPI    Review of Systems  All other systems reviewed and are negative.      Objective:   Physical Exam        Assessment & Plan:

## 2018-01-24 NOTE — Patient Instructions (Signed)
Morton Neuralgia Morton neuralgia is a type of foot pain in the area closest to your toes. This area is sometimes called the ball of your foot. Morton neuralgia occurs when a branch of a nerve in your foot (digital nerve) becomes compressed. When this happens over a long period of time, the nerve can thicken (neuroma) and cause pain. This usually occurs between the third and fourth toe. Morton neuralgia can come and go but may get worse over time. What are the causes? Your digital nerve can become compressed and stretched at a point where it passes under a thick band of tissue that connects your toes (intermetatarsal ligament). Morton neuralgia can be caused by mild repetitive damage in this area. This type of damage can result from:  Activities such as running or jumping.  Wearing shoes that are too tight.  What increases the risk? You may be at risk for Morton neuralgia if you:  Are female.  Wear high heels.  Wear shoes that are narrow or tight.  Participate in activities that stretch your toes. These include: ? Running. ? Ballet. ? Long-distance walking.  What are the signs or symptoms? The first symptom of Morton neuralgia is pain that spreads from the ball of your foot to your toes. It may feel like you are walking on a marble. Pain usually gets worse with walking and goes away at night. Other symptoms may include numbness and cramping of your toes. How is this diagnosed? Your health care provider will do a physical exam. When doing the exam, your health care provider may:  Squeeze your foot just behind your toe.  Ask you to move your toes to check for pain.  You may also have tests on your foot to confirm the diagnosis. These may include:  An X-ray.  An MRI.  How is this treated? Treatment for Morton neuralgia may be as simple as changing the kind of shoes you wear. Other treatments may include:  Wearing a supportive pad (orthosis) under the front of your foot. This  lifts your toe bones and takes pressure off the nerve.  Getting injections of numbing medicine and anti-inflammatory medicine (steroid) in the nerve.  Having surgery to remove part of the thickened nerve.  Follow these instructions at home:  Take medicine only as directed by your health care provider.  Wear soft-soled shoes with a wide toe area.  Stop activities that may be causing pain.  Elevate your foot when resting.  Massage your foot.  Apply ice to the injured area: ? Put ice in a plastic bag. ? Place a towel between your skin and the bag. ? Leave the ice on for 20 minutes, 2-3 times a day.  Keep all follow-up visits as directed by your health care provider. This is important. Contact a health care provider if:  Home care instructions are not helping you get better.  Your symptoms change or get worse. This information is not intended to replace advice given to you by your health care provider. Make sure you discuss any questions you have with your health care provider. Document Released: 08/27/2000 Document Revised: 10/27/2015 Document Reviewed: 07/22/2013 Elsevier Interactive Patient Education  2018 Elsevier Inc.  

## 2018-01-26 NOTE — Progress Notes (Signed)
Subjective:   Patient ID: Barbara Parker, female   DOB: 72 y.o.   MRN: [id number]   HPI Patient presents stating she is had a lot of pain in her right foot without specific injury and it shoots and radiates and seems to come from the forefoot and into the ankle and is been going on for fairly long time and is worsened recently.  Patient does not smoke and likes to be active   Review of Systems  All other systems reviewed and are negative.       Objective:  Physical Exam  Constitutional: She appears well-developed and well-nourished.  Cardiovascular: Intact distal pulses.  Pulmonary/Chest: Effort normal.  Musculoskeletal: Normal range of motion.  Neurological: She is alert.  Skin: Skin is warm.  Nursing note and vitals reviewed.   Neurovascular status intact muscle strength was found to be adequate range of motion was within normal limits with exquisite discomfort third interspace right with radiating discomfort into the adjacent digits and what appears to be palpable mass with positive Mulder sign.  Mild ankle pain and forefoot pain but not to the degree of what she is experiencing between the third and fourth toes with good digital perfusion     Assessment:  Probability for neuroma symptomatology right quite symptomatic at this time     Plan:  Patient.  Condition reviewed and will get a try neural lysis injection and I did explain that ultimately this may require surgical intervention.  I did do a sterile prep of the forefoot and I injected directly into the nerve root with a purified alcohol Marcaine solution tolerated well and advised on wider shoes and will be seen back 2 weeks to see the response  X-ray indicates that there is no signs of stress fracture or arthritis

## 2018-02-07 ENCOUNTER — Ambulatory Visit: Payer: Medicare Other | Admitting: Podiatry

## 2018-02-07 ENCOUNTER — Encounter: Payer: Self-pay | Admitting: Podiatry

## 2018-02-07 DIAGNOSIS — D3610 Benign neoplasm of peripheral nerves and autonomic nervous system, unspecified: Secondary | ICD-10-CM

## 2018-02-07 DIAGNOSIS — G5701 Lesion of sciatic nerve, right lower limb: Secondary | ICD-10-CM

## 2018-02-07 NOTE — Progress Notes (Signed)
Subjective:   Patient ID: Barbara Parker, female   DOB: 72 y.o.   MRN: [id number]   HPI Patient states right foot seems to be better but I still get some shooting pains but I am satisfied that we are making progress   ROS      Objective:  Physical Exam  Neurovascular status intact with shooting pain third interspace bilateral with mild radiating-like discomforts     Assessment:  Neuroma symptomatology right improving but present     Plan:  Will continue conservative care and today I did a sterile prep of the right forefoot I injected directly into the nerve root with a purified alcohol Marcaine solution and advised on wider type shoe gear.  Reappoint 3 weeks will probably require several other treatments

## 2018-02-25 DIAGNOSIS — H40011 Open angle with borderline findings, low risk, right eye: Secondary | ICD-10-CM | POA: Diagnosis not present

## 2018-02-28 ENCOUNTER — Ambulatory Visit (INDEPENDENT_AMBULATORY_CARE_PROVIDER_SITE_OTHER): Payer: Medicare Other | Admitting: Podiatry

## 2018-02-28 ENCOUNTER — Encounter: Payer: Self-pay | Admitting: Podiatry

## 2018-02-28 DIAGNOSIS — G5701 Lesion of sciatic nerve, right lower limb: Secondary | ICD-10-CM

## 2018-02-28 DIAGNOSIS — D3610 Benign neoplasm of peripheral nerves and autonomic nervous system, unspecified: Secondary | ICD-10-CM

## 2018-03-03 NOTE — Progress Notes (Signed)
Subjective:   Patient ID: Barbara Parker, female   DOB: 72 y.o.   MRN: [id number]   HPI Patient states that she seems to be somewhat improved but is still having shooting pains at times between the third and fourth toe of the right foot   ROS      Objective:  Physical Exam  Neurovascular status intact muscle strength is adequate with patient's third interspace right continue to exhibit mild discomfort but improved from previous     Assessment:  Neuroma symptomatology right which is present but improved     Plan:  H&P condition reviewed and today we did a sterile prep and did one final injection of a purified alcohol Marcaine solution into the third interspace which was tolerated well.  Sterile dressing applied and patient will be seen back as needed

## 2018-03-21 ENCOUNTER — Other Ambulatory Visit (INDEPENDENT_AMBULATORY_CARE_PROVIDER_SITE_OTHER): Payer: Medicare Other

## 2018-03-21 DIAGNOSIS — Z23 Encounter for immunization: Secondary | ICD-10-CM

## 2018-05-22 ENCOUNTER — Other Ambulatory Visit: Payer: Medicare Other

## 2018-05-22 DIAGNOSIS — M810 Age-related osteoporosis without current pathological fracture: Secondary | ICD-10-CM

## 2018-05-22 MED ORDER — DENOSUMAB 60 MG/ML ~~LOC~~ SOSY
60.0000 mg | PREFILLED_SYRINGE | Freq: Once | SUBCUTANEOUS | Status: AC
  Administered 2018-05-22: 60 mg via SUBCUTANEOUS

## 2018-06-06 DIAGNOSIS — C73 Malignant neoplasm of thyroid gland: Secondary | ICD-10-CM | POA: Diagnosis not present

## 2018-06-06 DIAGNOSIS — E890 Postprocedural hypothyroidism: Secondary | ICD-10-CM | POA: Diagnosis not present

## 2018-06-12 DIAGNOSIS — Z85850 Personal history of malignant neoplasm of thyroid: Secondary | ICD-10-CM | POA: Diagnosis not present

## 2018-06-12 DIAGNOSIS — Z8262 Family history of osteoporosis: Secondary | ICD-10-CM | POA: Diagnosis not present

## 2018-06-12 DIAGNOSIS — Z803 Family history of malignant neoplasm of breast: Secondary | ICD-10-CM | POA: Diagnosis not present

## 2018-06-12 DIAGNOSIS — M810 Age-related osteoporosis without current pathological fracture: Secondary | ICD-10-CM | POA: Diagnosis not present

## 2018-06-12 DIAGNOSIS — Z1231 Encounter for screening mammogram for malignant neoplasm of breast: Secondary | ICD-10-CM | POA: Diagnosis not present

## 2018-06-12 DIAGNOSIS — Z8269 Family history of other diseases of the musculoskeletal system and connective tissue: Secondary | ICD-10-CM | POA: Diagnosis not present

## 2018-06-12 LAB — HM MAMMOGRAPHY

## 2018-06-12 LAB — HM DEXA SCAN

## 2018-06-13 DIAGNOSIS — C73 Malignant neoplasm of thyroid gland: Secondary | ICD-10-CM | POA: Diagnosis not present

## 2018-06-13 DIAGNOSIS — E890 Postprocedural hypothyroidism: Secondary | ICD-10-CM | POA: Diagnosis not present

## 2018-06-18 ENCOUNTER — Encounter: Payer: Self-pay | Admitting: *Deleted

## 2018-07-04 ENCOUNTER — Other Ambulatory Visit: Payer: Self-pay | Admitting: Family Medicine

## 2018-07-04 DIAGNOSIS — K219 Gastro-esophageal reflux disease without esophagitis: Secondary | ICD-10-CM

## 2018-07-04 NOTE — Telephone Encounter (Signed)
Pt has an appt in march. Will give enough until then

## 2018-08-07 DIAGNOSIS — H1045 Other chronic allergic conjunctivitis: Secondary | ICD-10-CM | POA: Diagnosis not present

## 2018-08-27 NOTE — Progress Notes (Signed)
Chief Complaint  Patient presents with  . Medicare Wellness    fasting AWV/CPE with pelvic. Sees eye doctor yearly. No concerns.     Barbara Parker is a 73 y.o. female who presents for annual physical exam, Medicare wellness visit and follow-up on chronic medical conditions.    Osteoporosis:  She has been getting Prolia injections and tolerating without side effects. Last injection was 05/2018. She had DEXA 12/2016which showed osteoporosis in the spine, T-3.4. She had Reclast infusion on 07/05/15, followed by vomiting and diarrhea and severe aches in the joints (pain lasted up to 2 weeks).  Last DEXA was 06/2018, showing significant improvement in all areas, lowest measured area was spine, T-2.6.  Vitamin D deficiency:  Level was low at 22.2 last year (08/2017), when taking 1000 IU daily. She was treated with 8 weeks of 50,000 IU weekly, and then advised to increase to 2000 IU daily.  She has been taking 2000 IU, only occasionally forgets.  Hypothyroidism: She isunder the care of Dr. Chalmers Cater. No notes/labs received. She reports she was last seen in January, and dose was not changed. Dose has remained stable at 112 mcg. Denies hair or skin changes (slightly dry skin, unchanged).  GERD: She tries to take Dexilant qod, but if she has a flare, may need to take it daily for a few days in a row, then back to qod. Denies dysphagia.  H/o Shingles/Postherpetic neuralgia: No longer has the residual itching over her right eyebrow. She also had decreased hearing in the right ear.  She saw audiologist at Dr. Janeice Robinson office, and hashearing aids for both ears.  Urinary urge incontinence--Isn't as bothersome as in the past, as long as she remembers to void more frequently (which doesn't always happen when she is very busy). Denies leakage with cough/sneeze. Denies dysuria, hematuria.  Right foot pain:  Has been seeing Dr. Paulla Dolly and getting cortisone injections for neuroma.  She had 3 shots and it is  feeling much better.   Immunization History  Administered Date(s) Administered  . Influenza Split 04/09/2011, 03/25/2012  . Influenza, High Dose Seasonal PF 03/16/2013, 03/29/2015, 02/22/2016, 03/05/2017, 03/21/2018  . Pneumococcal Conjugate-13 03/16/2013  . Pneumococcal Polysaccharide-23 04/20/2015  . Tdap 04/09/2011  . Zoster 05/15/2011   Last Pap smear: 2004; s/p hysterectomy (for fibroids) Last mammogram:06/2018 Last colonoscopy: 04/2012 Dr. Collene Mares  Last DEXA: 06/2018 T-2.6 at spine Dentist: twice yearly  Ophtho: twice yearly Exercise: going to the gym 3x/week (yoga once a week, dancing 2x/week plus weights). Some walking/hiking on nice days. Lipids: Lab Results  Component Value Date   CHOL 220 (H) 08/26/2017   HDL 85 08/26/2017   LDLCALC 121 (H) 08/26/2017   TRIG 68 08/26/2017   CHOLHDL 2.6 08/26/2017    Other doctors caring for patient include: Ophtho: Dr. [place] Dentist: Dr. Altamese [place] GI: Dr. Collene Mares Endocrinologist: Dr. Chalmers Cater Ortho: Dr. Maxie Better (stress fracture R foot after fall 11/2014) ENT: Dr. Constance Holster (for broken nose from fall 11/2014, hearing loss) Derm: Dr. Renda Rolls (prn only) Podiatrist: Dr. Paulla Dolly  Depression screen: Negative  Fall screen:None in the last year  Functional Status Survey: unremarkable (wears hearing aids) Mini-Cog screen: 5 (normal) See full screens in Epic  End of Life Discussion: Patient hasa living will and medical power of attorney  Past Medical History:  Diagnosis Date  . GERD (gastroesophageal reflux disease)   . Hyperthyroidism   . Hypothyroidism (acquired) 12/2005   history of thyroid cancer and thyroidectomy; Dr. Chalmers Cater  . Internal hemorrhoids 06/30/2002  Dr.Mann  . Osteoporosis   . Shingles 06/2013   affecting R eye and scalp  . Thyroid cancer ([place]) 2007    Past Surgical History:  Procedure Laterality Date  . ABDOMINAL HYSTERECTOMY  1987   still has ovaries. (benign reasons)  . THYROIDECTOMY  12/2005    Social History    Socioeconomic History  . Marital status: Married    Spouse name: Not on file  . Number of children: 2  . Years of education: Not on file  . Highest education level: Not on file  Occupational History  . 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 Financial resource strain: Not on file  . Food insecurity:    Worry: Not on file    Inability: Not on file  . Transportation needs:    Medical: Not on file    Non-medical: Not on file  Tobacco Use  . Smoking status: Never Smoker  . Smokeless tobacco: Never Used  Substance and Sexual Activity  . Alcohol use: Yes    Comment: glass or two of wine maybe twice a year.  . Drug use: No  . Sexual activity: Not Currently    Partners: Male  Lifestyle  . Physical activity:    Days per week: Not on file    Minutes per session: Not on file  . Stress: Not on file  Relationships  . Social connections:    Talks on phone: Not on file    Gets together: Not on file    Attends religious service: Not on file    Active member of club or organization: Not on file    Attends meetings of clubs or organizations: Not on file    Relationship status: Not on file  . Intimate partner violence:    Fear of current or ex partner: Not on file    Emotionally abused: Not on file    Physically abused: Not on file    Forced sexual activity: Not on file  Other Topics Concern  . Not on file  Social History Narrative   Widowed 01/2017. Son in [place], Daughter in [place]. 6 grandkids.   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 Dementia Mother   . Hypothyroidism Daughter   . Dementia Brother 66  . Breast cancer Maternal Grandmother 40          . Ovarian cancer Paternal Grandmother        and pancreatic  . Diabetes Neg Hx     Outpatient Encounter Medications as of 08/28/2018  Medication Sig Note  . brimonidine (ALPHAGAN P) 0.1 % SOLN  Place 1 drop into both eyes every 8 (eight) hours.   . cholecalciferol (VITAMIN D) 1000 units tablet Take 1,000 Units by mouth daily. 08/28/2018: Taking 2000 IU most days (occasionally misses)  . DEXILANT 60 MG capsule TAKE 1 CAPSULE BY MOUTH DAILY   . ketotifen (ZADITOR) 0.025 % ophthalmic solution Place 1 drop into both eyes 2 (two) times daily.   Marland Kitchen levothyroxine (SYNTHROID) 112 MCG tablet Take 112 mcg by mouth daily before breakfast.   . zolpidem (AMBIEN) 5 MG tablet Take 1 tablet (5 mg total) by mouth at bedtime as needed for sleep. (Patient not taking: Reported on 08/28/2018) 08/28/2018: Still has 20 at home; only very rarely used it, >6 months, possibly 1 year.  . [DISCONTINUED] Vitamin D, Ergocalciferol, (  DRISDOL) 50000 units CAPS capsule Take 1 capsule (50,000 Units total) by mouth every 7 (seven) days.    No facility-administered encounter medications on file as of 08/28/2018.     Allergies  Allergen Reactions  . Codeine Anaphylaxis  . Reclast [Zoledronic Acid] Diarrhea and Nausea And Vomiting    Joint pain  . Prednisone     Nausea and vomiting    ROS: The patient denies anorexia, fever, vision changes, ear pain, sore throat, breast concerns, chest pain, palpitations, dizziness, syncope, dyspnea on exertion, cough, swelling, nausea, vomiting, constipation, abdominal pain, melena, hematochezia, indigestion/heartburn(controlled with Dexilant), hematuria, dysuria, vaginal bleeding, discharge, odor or itch, genital lesions, joint pains, numbness, tingling, weakness, tremor, suspicious skin lesions, depression, anxiety, abnormal bleeding/bruising, or enlarged lymph nodes.  Chronically bruises easily, unchanged. urinary urgency, no longer having incontinence. Some nights she gets up to void (just once) decreased hearing inR>L ears--has bilateral hearing aids. Right foot pain improved after cortisone shots. Slight shortness of breath while at altitude (hiking), short-lived.  No problems in  her regular aerobic classes.   PHYSICAL EXAM:  BP 110/62   Pulse 68   Ht 5\' 6"  (1.676 m)   Wt 165 lb 9.6 oz (75.1 kg)   BMI 26.73 kg/m   Wt Readings from Last 3 Encounters:  08/28/18 165 lb 9.6 oz (75.1 kg)  08/21/17 162 lb 9.6 oz (73.8 kg)  10/03/16 165 lb 3.2 oz (74.9 kg)    General Appearance:  Alert, cooperative, no distress, appears stated age   Head:  Normocephalic, without obvious abnormality, atraumatic   Eyes:  PERRL, conjunctiva/corneas clear, EOM's intact, fundi benign.   Ears:  Normal TM's and external ear canals   Nose:  Nares normal, mucosa mildly edematous, no drainage or sinus tenderness   Throat:  Lips, mucosa, and tongue normal; teeth and gums normal   Neck:  Supple, no lymphadenopathy; thyroid: no enlargement/ tenderness/nodules; no carotid bruit or JVD   Back:  Spine nontender, no curvature, ROM normal, no CVA tenderness   Lungs:  Clear to auscultation bilaterally without wheezes, rales or ronchi; respirations unlabored   Chest Wall:  No tenderness or deformity   Heart:  Regular rate and rhythm, S1 and S2 normal, no murmur, rub or gallop   Breast Exam:  No tenderness, masses, or nipple discharge or inversion. No axillary lymphadenopathy   Abdomen:  Soft, non-tender, nondistended, active bowel sounds, no masses, no hepatosplenomegaly   Genitalia:  Normal external genitalia without lesions. +atrophic changes noted at introitus. BUS and vagina normal; Uterus absent; and adnexa not enlarged, not palpable, nontender, no masses. Pap not performed   Rectal:  Normal tone, no masses or tenderness;heme negative, soft light brown stool  Extremities:  No clubbing, cyanosis or edema.   Pulses:  2+ and symmetric all extremities   Skin:  Skin color, texture, turgor normal, no rashes or lesions.   Lymph nodes:  Cervical, supraclavicular, and axillary nodes normal   Neurologic:  CNII-XII intact, normal strength, sensation and gait; reflexes  2+ and symmetric throughout   Psych: Normal mood, affect, hygiene and grooming    ASSESSMENT/PLAN:  Annual physical exam - Plan: POCT Urinalysis DIP (Proadvantage Device)  Medicare annual wellness visit, subsequent  Vitamin D deficiency - Recheck level and adjust OTC dose if needed - Plan: VITAMIN D 25 Hydroxy (Vit-D Deficiency, Fractures), Vitamin D, Ergocalciferol, (DRISDOL) 1.25 MG (50000 UT) CAPS capsule  Osteoporosis, unspecified osteoporosis type, unspecified pathological fracture presence - Continue Prolia every 6 months. Counseled re:  Ca, D, weight-bearing exercise - Plan: VITAMIN D 25 Hydroxy (Vit-D Deficiency, Fractures)  Hypothyroidism, acquired - controlled, managed by Dr. Chalmers Cater  Gastroesophageal reflux disease, esophagitis presence not specified - controlled with Dexilant. Discussed potential risks with chronic PPI use, need for vitamins; use lowest effective dose  Medication monitoring encounter - Plan: CBC with Differential/Platelet, Comprehensive metabolic panel, VITAMIN D 25 Hydroxy (Vit-D Deficiency, Fractures)   Discussed monthly self breast exams and yearly mammograms; at least 30 minutes of aerobic activity at least 5 days/week, weight bearing exercise at least 2x/wk; proper sunscreen use reviewed; healthy diet, including goals of calcium and vitamin D intake and alcohol recommendations (less than or equal to 1 drink/day) reviewed; regular seatbelt use; changing batteries in smoke detectors. Immunization recommendations discussed--UTD; consider Shingrix recommended, from pharmacy; risks/SE reviewed. Colonoscopy recommendations reviewed--UTD.  She was asked to get [place] copies of living will and healthcare power of attorney.  She was given new forms in case she can't find the ones she did last year.   She is Full Code, Full Care. MOST form reviewed and updated.  Check with Dr. Chalmers Cater about your thyroid dosing--technically you should NOT take the  thyroid medication with your dexilant, should be 4 hours apart.   Medicare Attestation I have personally reviewed: The patient's medical and social history Their use of alcohol, tobacco or illicit drugs Their current medications and supplements The patient's functional ability including ADLs,fall risks, home safety risks, cognitive, and hearing and visual impairment Diet and physical activities Evidence for depression or mood disorders  The patient's weight, height and BMI have been recorded in the chart.  I have made referrals, counseling, and provided education to the patient based on review of the above and I have provided the patient with a written personalized care plan for preventive services.

## 2018-08-27 NOTE — Patient Instructions (Addendum)
  HEALTH MAINTENANCE RECOMMENDATIONS:  It is recommended that you get at least 30 minutes of aerobic exercise at least 5 days/week (for weight loss, you may need as much as 60-90 minutes). This can be any activity that gets your heart rate up. This can be divided in 10-15 minute intervals if needed, but try and build up your endurance at least once a week.  Weight bearing exercise is also recommended twice weekly.  Eat a healthy diet with lots of vegetables, fruits and fiber.  "Colorful" foods have a lot of vitamins (ie green vegetables, tomatoes, red peppers, etc).  Limit sweet tea, regular sodas and alcoholic beverages, all of which has a lot of calories and sugar.  Up to 1 alcoholic drink daily may be beneficial for women (unless trying to lose weight, watch sugars).  Drink a lot of water.  Calcium recommendations are 1200-1500 mg daily (1500 mg for postmenopausal women or women without ovaries), and vitamin D 1000 IU daily.  This should be obtained from diet and/or supplements (vitamins), and calcium should not be taken all at once, but in divided doses.  Monthly self breast exams and yearly mammograms for women over the age of 30 is recommended.  Sunscreen of at least SPF 30 should be used on all sun-exposed parts of the skin when outside between the hours of 10 am and 4 pm (not just when at beach or pool, but even with exercise, golf, tennis, and yard work!)  Use a sunscreen that says "broad spectrum" so it covers both UVA and UVB rays, and make sure to reapply every 1-2 hours.  Remember to change the batteries in your smoke detectors when changing your clock times in the spring and fall.  Use your seat belt every time you are in a car, and please drive safely and not be distracted with cell phones and texting while driving.   Ms. Drzewiecki , Thank you for taking time to come for your Medicare Wellness Visit. I appreciate your ongoing commitment to your health goals. Please review the following  plan we discussed and let me know if I can assist you in the future.   This is a list of the screening recommended for you and due dates:  Health Maintenance  Topic Date Due  . Mammogram  06/12/2020  . Tetanus Vaccine  04/08/2021  . Colon Cancer Screening  04/11/2022  . Flu Shot  Completed  . DEXA scan (bone density measurement)  Completed  .  Hepatitis C: One time screening is recommended by Center for Disease Control  (CDC) for  adults born from [birth year] through 1963.   Completed  . Pneumonia vaccines  Completed   Continue yearly mammograms (next due 06/2019, not 2022 as stated above). Continue yearly high dose flu shots. Bone density test due again 06/2020  I recommend getting the new shingles vaccine (Shingrix). You will need to check with your insurance to see if it is covered, and if covered by Medicare Part D, you need to get from the pharmacy rather than our office.  It is a series of 2 injections, spaced 2 months apart.  Check with Dr. Chalmers Cater about your thyroid dosing--technically you should NOT take the thyroid medication with your dexilant, should be 4 hours apart.

## 2018-08-28 ENCOUNTER — Other Ambulatory Visit: Payer: Self-pay

## 2018-08-28 ENCOUNTER — Encounter: Payer: Self-pay | Admitting: Family Medicine

## 2018-08-28 ENCOUNTER — Ambulatory Visit (INDEPENDENT_AMBULATORY_CARE_PROVIDER_SITE_OTHER): Payer: Medicare Other | Admitting: Family Medicine

## 2018-08-28 VITALS — BP 110/62 | HR 68 | Ht 66.0 in | Wt 165.6 lb

## 2018-08-28 DIAGNOSIS — E039 Hypothyroidism, unspecified: Secondary | ICD-10-CM

## 2018-08-28 DIAGNOSIS — M810 Age-related osteoporosis without current pathological fracture: Secondary | ICD-10-CM

## 2018-08-28 DIAGNOSIS — Z0000 Encounter for general adult medical examination without abnormal findings: Secondary | ICD-10-CM | POA: Diagnosis not present

## 2018-08-28 DIAGNOSIS — E559 Vitamin D deficiency, unspecified: Secondary | ICD-10-CM | POA: Diagnosis not present

## 2018-08-28 DIAGNOSIS — K219 Gastro-esophageal reflux disease without esophagitis: Secondary | ICD-10-CM | POA: Diagnosis not present

## 2018-08-28 DIAGNOSIS — Z5181 Encounter for therapeutic drug level monitoring: Secondary | ICD-10-CM | POA: Diagnosis not present

## 2018-08-28 LAB — POCT URINALYSIS DIP (PROADVANTAGE DEVICE)
Bilirubin, UA: NEGATIVE
Blood, UA: NEGATIVE
Glucose, UA: NEGATIVE mg/dL
Ketones, POC UA: NEGATIVE mg/dL
Leukocytes, UA: NEGATIVE
Nitrite, UA: NEGATIVE
Protein Ur, POC: NEGATIVE mg/dL
Specific Gravity, Urine: 1.015
Urobilinogen, Ur: NEGATIVE
pH, UA: 8 (ref 5.0–8.0)

## 2018-08-29 ENCOUNTER — Other Ambulatory Visit: Payer: Self-pay | Admitting: Family Medicine

## 2018-08-29 DIAGNOSIS — K219 Gastro-esophageal reflux disease without esophagitis: Secondary | ICD-10-CM

## 2018-08-29 LAB — CBC WITH DIFFERENTIAL/PLATELET
Basophils Absolute: 0 10*3/uL (ref 0.0–0.2)
Basos: 1 %
EOS (ABSOLUTE): 0.1 10*3/uL (ref 0.0–0.4)
Eos: 2 %
Hematocrit: 38 % (ref 34.0–46.6)
Hemoglobin: 13.2 g/dL (ref 11.1–15.9)
Immature Grans (Abs): 0 10*3/uL (ref 0.0–0.1)
Immature Granulocytes: 0 %
Lymphocytes Absolute: 1.3 10*3/uL (ref 0.7–3.1)
Lymphs: 17 %
MCH: 29.3 pg (ref 26.6–33.0)
MCHC: 34.7 g/dL (ref 31.5–35.7)
MCV: 84 fL (ref 79–97)
Monocytes Absolute: 0.7 10*3/uL (ref 0.1–0.9)
Monocytes: 9 %
NEUTROS PCT: 71 %
Neutrophils Absolute: 5.1 10*3/uL (ref 1.4–7.0)
Platelets: 346 10*3/uL (ref 150–450)
RBC: 4.51 x10E6/uL (ref 3.77–5.28)
RDW: 13.4 % (ref 11.7–15.4)
WBC: 7.2 10*3/uL (ref 3.4–10.8)

## 2018-08-29 LAB — COMPREHENSIVE METABOLIC PANEL
ALBUMIN: 4.4 g/dL (ref 3.8–4.8)
ALK PHOS: 132 IU/L — AB (ref 39–117)
ALT: 19 IU/L (ref 0–32)
AST: 23 IU/L (ref 0–40)
Albumin/Globulin Ratio: 1.7 (ref 1.2–2.2)
BUN / CREAT RATIO: 22 (ref 12–28)
BUN: 16 mg/dL (ref 8–27)
Bilirubin Total: 0.4 mg/dL (ref 0.0–1.2)
CO2: 23 mmol/L (ref 20–29)
Calcium: 8.9 mg/dL (ref 8.7–10.3)
Chloride: 102 mmol/L (ref 96–106)
Creatinine, Ser: 0.73 mg/dL (ref 0.57–1.00)
GFR calc Af Amer: 96 mL/min/{1.73_m2} (ref >59)
GFR calc non Af Amer: 84 mL/min/{1.73_m2} (ref >59)
Globulin, Total: 2.6 g/dL (ref 1.5–4.5)
Glucose: 94 mg/dL (ref 65–99)
Potassium: 4.7 mmol/L (ref 3.5–5.2)
Sodium: 142 mmol/L (ref 134–144)
Total Protein: 7 g/dL (ref 6.0–8.5)

## 2018-08-29 LAB — VITAMIN D 25 HYDROXY (VIT D DEFICIENCY, FRACTURES): VIT D 25 HYDROXY: 21.2 ng/mL — AB (ref 30.0–100.0)

## 2018-08-29 MED ORDER — VITAMIN D (ERGOCALCIFEROL) 1.25 MG (50000 UNIT) PO CAPS: 50000.0000 [IU] | ORAL_CAPSULE | ORAL | 0 refills | Status: DC

## 2018-09-01 NOTE — Telephone Encounter (Signed)
How long do you want this refilled for?

## 2018-09-04 ENCOUNTER — Encounter: Payer: Self-pay | Admitting: Family Medicine

## 2018-10-21 ENCOUNTER — Telehealth: Payer: Self-pay | Admitting: Family Medicine

## 2018-10-21 NOTE — Telephone Encounter (Signed)
Pt called and is requesting the antibody test states she is going back to work on June 1st pt can be reached at [phone]

## 2018-10-21 NOTE — Telephone Encounter (Signed)
The antibody test should only be used for people who had been sick (and recovered), to see if that's what caused the illness and they are immune.  If she hasn't been sick with COVID-like symptoms in the last 2-3 months, it likely won't be helpful (and will be negative).  Not sure if this will be covered by her insurance or not, or if it is being required for her by her work.  Not sure what other providers are doing in this regards.  If needed/desired, I guess use dx code for either screening or history of viral illness

## 2018-10-21 NOTE — Telephone Encounter (Signed)
Left message for pt to call back and let us know if work is requiring this or if she has been sick recently and wants this done

## 2018-10-22 DIAGNOSIS — H40053 Ocular hypertension, bilateral: Secondary | ICD-10-CM | POA: Diagnosis not present

## 2018-11-20 ENCOUNTER — Other Ambulatory Visit: Payer: Self-pay | Admitting: Family Medicine

## 2018-11-20 DIAGNOSIS — E559 Vitamin D deficiency, unspecified: Secondary | ICD-10-CM

## 2018-11-28 ENCOUNTER — Other Ambulatory Visit: Payer: Self-pay

## 2018-11-28 ENCOUNTER — Other Ambulatory Visit: Payer: Medicare Other

## 2018-11-28 DIAGNOSIS — M810 Age-related osteoporosis without current pathological fracture: Secondary | ICD-10-CM | POA: Diagnosis not present

## 2018-11-28 MED ORDER — DENOSUMAB 60 MG/ML ~~LOC~~ SOSY
60.0000 mg | PREFILLED_SYRINGE | Freq: Once | SUBCUTANEOUS | Status: AC
  Administered 2018-11-28: 60 mg via SUBCUTANEOUS

## 2018-12-26 DIAGNOSIS — Z03818 Encounter for observation for suspected exposure to other biological agents ruled out: Secondary | ICD-10-CM | POA: Diagnosis not present

## 2019-04-01 DIAGNOSIS — M1711 Unilateral primary osteoarthritis, right knee: Secondary | ICD-10-CM | POA: Diagnosis not present

## 2019-04-01 DIAGNOSIS — M1712 Unilateral primary osteoarthritis, left knee: Secondary | ICD-10-CM | POA: Diagnosis not present

## 2019-04-27 DIAGNOSIS — H401131 Primary open-angle glaucoma, bilateral, mild stage: Secondary | ICD-10-CM | POA: Diagnosis not present

## 2019-04-27 DIAGNOSIS — H52223 Regular astigmatism, bilateral: Secondary | ICD-10-CM | POA: Diagnosis not present

## 2019-06-02 ENCOUNTER — Other Ambulatory Visit: Payer: Self-pay

## 2019-06-02 ENCOUNTER — Other Ambulatory Visit (INDEPENDENT_AMBULATORY_CARE_PROVIDER_SITE_OTHER): Payer: Medicare Other

## 2019-06-02 DIAGNOSIS — M810 Age-related osteoporosis without current pathological fracture: Secondary | ICD-10-CM

## 2019-06-02 MED ORDER — DENOSUMAB 60 MG/ML ~~LOC~~ SOSY
60.0000 mg | PREFILLED_SYRINGE | Freq: Once | SUBCUTANEOUS | Status: AC
  Administered 2019-06-02: 60 mg via SUBCUTANEOUS

## 2019-06-08 DIAGNOSIS — E890 Postprocedural hypothyroidism: Secondary | ICD-10-CM | POA: Diagnosis not present

## 2019-06-10 DIAGNOSIS — H401121 Primary open-angle glaucoma, left eye, mild stage: Secondary | ICD-10-CM | POA: Diagnosis not present

## 2019-06-12 DIAGNOSIS — E890 Postprocedural hypothyroidism: Secondary | ICD-10-CM | POA: Diagnosis not present

## 2019-06-12 DIAGNOSIS — C73 Malignant neoplasm of thyroid gland: Secondary | ICD-10-CM | POA: Diagnosis not present

## 2019-06-18 DIAGNOSIS — Z1231 Encounter for screening mammogram for malignant neoplasm of breast: Secondary | ICD-10-CM | POA: Diagnosis not present

## 2019-06-18 LAB — HM MAMMOGRAPHY

## 2019-06-22 ENCOUNTER — Encounter: Payer: Self-pay | Admitting: *Deleted

## 2019-07-07 DIAGNOSIS — H401131 Primary open-angle glaucoma, bilateral, mild stage: Secondary | ICD-10-CM | POA: Diagnosis not present

## 2019-09-01 NOTE — Patient Instructions (Addendum)
  HEALTH MAINTENANCE RECOMMENDATIONS:  It is recommended that you get at least 30 minutes of aerobic exercise at least 5 days/week (for weight loss, you may need as much as 60-90 minutes). This can be any activity that gets your heart rate up. This can be divided in 10-15 minute intervals if needed, but try and build up your endurance at least once a week.  Weight bearing exercise is also recommended twice weekly.  Eat a healthy diet with lots of vegetables, fruits and fiber.  "Colorful" foods have a lot of vitamins (ie green vegetables, tomatoes, red peppers, etc).  Limit sweet tea, regular sodas and alcoholic beverages, all of which has a lot of calories and sugar.  Up to 1 alcoholic drink daily may be beneficial for women (unless trying to lose weight, watch sugars).  Drink a lot of water.  Calcium recommendations are 1200-1500 mg daily (1500 mg for postmenopausal women or women without ovaries), and vitamin D 1000 IU daily.  This should be obtained from diet and/or supplements (vitamins), and calcium should not be taken all at once, but in divided doses.  Monthly self breast exams and yearly mammograms for women over the age of 43 is recommended.  Sunscreen of at least SPF 30 should be used on all sun-exposed parts of the skin when outside between the hours of 10 am and 4 pm (not just when at beach or pool, but even with exercise, golf, tennis, and yard work!)  Use a sunscreen that says "broad spectrum" so it covers both UVA and UVB rays, and make sure to reapply every 1-2 hours.  Remember to change the batteries in your smoke detectors when changing your clock times in the spring and fall. Carbon monoxide detectors are recommended for your home.  Use your seat belt every time you are in a car, and please drive safely and not be distracted with cell phones and texting while driving.   Ms. Frankenberg , Thank you for taking time to come for your Medicare Wellness Visit. I appreciate your ongoing  commitment to your health goals. Please review the following plan we discussed and let me know if I can assist you in the future.   This is a list of the screening recommended for you and due dates:  Health Maintenance  Topic Date Due  . Flu Shot  Fall 2021  . Tetanus Vaccine  04/08/2021  . Mammogram  06/17/2020  . Colon Cancer Screening  04/11/2022  . DEXA scan (bone density measurement)  06/2020  .  Hepatitis C: One time screening is recommended by Center for Disease Control  (CDC) for  adults born from [birth year] through 1963.   Completed  . Pneumonia vaccines  Completed   Continue yearly high dose flu shots.  Please get [place] the dates of Shingrix vaccines so that we can enter them in your chart.  Please get [place] copies of your living will and healthcare power of attorney at your convenience, so that they get scanned into your medical chart.

## 2019-09-01 NOTE — Progress Notes (Signed)
Chief Complaint  Patient presents with  . Medicare Wellness    fasting AWV/CPE. No new concerns.     Barbara Parker is a 74 y.o. female who presents for annual physical exam, Medicare wellness visit and follow-up on chronic medical conditions.  Osteoporosis: She has been getting Prolia injections and tolerating without side effects. Last injection was 05/2019. She had DEXA 12/2016which showed osteoporosis in the spine, T-3.4. She had Reclast infusion on 07/05/15, followed by vomiting and diarrhea and severe aches in the joints (pain lasted up to 2 weeks).  Last DEXA was 06/2018, showing significant improvement in all areas, lowest measured area was spine, T-2.6.  Vitamin D deficiency:  Level was low at 21.2 in 08/2018 when reportedly taking 2000 IU daily.  She was treated with prescription D and advised to increase her daily dose to 4000 IU daily.  The year prior (08/2017), level had been 22.2 when taking 1000 IU daily. She is currently taking 2000 IU daily, but reports missing a dose about once/week.  Hypothyroidism: She isunder the care of Dr. Chalmers Cater. No notes/labs received. She was last seen 06/2019 per patient. Dose has remained stable at 112 mcg. Denies hair or skin changes (slightly dry skin, unchanged).  GERD: She tries to take Dexilant qod, but if she has a flare, may need to take it daily for a few days in a row, then back to qod. She would take it daily if it weren't so expensive. Denies dysphagia.  Decreased hearing in the right > left ears. She saw audiologist at Dr. Janeice Robinson office in the past, and hashearing aids for both ears. Denies changes to her hearing.  Urinary urge incontinence--Isn't as bothersome as in the past, as long as she remembers to void more frequently.Denies leakage with cough/sneeze. Denies dysuria, hematuria. Usually gets one up at night to void, some nights she sleeps through.  Saw Dr. Rhona Raider for R knee--she injured both knees with a fall (poss in  Jan?); R knee swelled, did x-rays put on NSAID, has recurrent swelling when she stops.  Patient plans to follow-up.  She has fallen 4 times, which she relates to wearing her mask.  Once in the grocery store, can't see down as well with a mask on, and has tripped.  She fell once on a sidewalk (while wearing mask); fell once in her driveway, where there was a raised area, flip-flop got caught on it (wasn't wearing mask). She reports only wearing mask when she absolutely has to, due to falling. She reports she really hasn't modified her activities related to COVID--she still sees her grandkids regularly, goes out to eat.  She washes her hands and tries to be careful. Only wears mask when required (ie in the office today, stores).  Glaucoma, under the care of Dr. [place] (she denies being given that diagnosis, but seems to be on treatment).  Immunization History  Administered Date(s) Administered  . Influenza Split 04/09/2011, 03/25/2012, 03/05/2019  . Influenza, High Dose Seasonal PF 03/16/2013, 03/29/2015, 02/22/2016, 03/05/2017, 03/21/2018  . PFIZER SARS-COV-2 Vaccination 07/09/2019, 07/30/2019  . Pneumococcal Conjugate-13 03/16/2013  . Pneumococcal Polysaccharide-23 04/20/2015  . Tdap 04/09/2011  . Zoster 05/15/2011   Patient states she got her Shingrix through [place] (will get [place] the dates)  Last Pap smear: 2004; s/p hysterectomy (for fibroids) Last mammogram:06/2019 Last colonoscopy: 04/2012 Dr. Collene Mares  Last DEXA: 06/2018 T-2.6 at spine Dentist: twice yearly  Ophtho: twice yearly Exercise: Limited recently due to knee pain (since January).  She just  recently started back to walking and dance class. Prior to January she was taking a dance exercise class 3x/week, walking, and doing a lot of stairs on her work breaks.  She has some weights at home that she uses 2-3x/week.  Lipids: Lab Results  Component Value Date   CHOL 220 (H) 08/26/2017   HDL 85 08/26/2017   LDLCALC 121 (H)  08/26/2017   TRIG 68 08/26/2017   CHOLHDL 2.6 08/26/2017  Denies any significant changes in diet related to COVID.  Other doctors caring for patient include: Ophtho: Dr. [place] Dentist: Dr. Altamese Heflin GI: Dr. Collene Mares Endocrinologist: Dr. Chalmers Cater Ortho: Dr. Maxie Better (stress fracture R foot after fall 11/2014), Dr. Rhona Raider (knee) ENT: Dr. Constance Holster (for broken nose from fall 11/2014, hearing loss) Derm: Dr.Haverstock(prn only) Podiatrist: Dr. Paulla Dolly  Depression screen: Negative  Fall screen:4--some trouble with vision (decreased peripheral vision on the R, seeing eye doctor), and also related to mask-wearing affecting vision. Functional Status Survey: hearing loss (wears hearing aids), some vision changes (got glasses). Mini-Cog screen:5 (normal) See full screens in Epic  End of Life Discussion: Patient hasa living will and medical power of attorney; copies have not been received  PMH, PSH, SH and FH were reviewed and updated  Outpatient Encounter Medications as of 09/02/2019  Medication Sig Note  . brimonidine (ALPHAGAN P) 0.1 % SOLN Place 1 drop into both eyes every 8 (eight) hours.   . cholecalciferol (VITAMIN D) 1000 units tablet Take 2,000 Units by mouth daily.  09/02/2019: Taking 2000 IU daily, but misses dose about once a week  . DEXILANT 60 MG capsule TAKE 1 CAPSULE BY MOUTH DAILY 09/02/2019: Takes qod, but will need to take daily for a few days if reflux recurs  . latanoprost (XALATAN) 0.005 % ophthalmic solution Place 1 drop into the right eye at bedtime.   Marland Kitchen levothyroxine (SYNTHROID) 112 MCG tablet Take 112 mcg by mouth daily before breakfast.   . zolpidem (AMBIEN) 5 MG tablet Take 1 tablet (5 mg total) by mouth at bedtime as needed for sleep. (Patient not taking: Reported on 08/28/2018) 08/28/2018: Still has 20 at home; only very rarely used it, >6 months, possibly 1 year.  . [DISCONTINUED] ketotifen (ZADITOR) 0.025 % ophthalmic solution Place 1 drop into both eyes 2 (two) times daily.    . [DISCONTINUED] Vitamin D, Ergocalciferol, (DRISDOL) 1.25 MG (50000 UT) CAPS capsule Take 1 capsule (50,000 Units total) by mouth every 7 (seven) days.    No facility-administered encounter medications on file as of 09/02/2019.   Allergies  Allergen Reactions  . Codeine Anaphylaxis  . Reclast [Zoledronic Acid] Diarrhea and Nausea And Vomiting    Joint pain  . Prednisone     Nausea and vomiting    ROS: The patient denies anorexia, fever, vision changes (some decrease in peripheral vision on right, and difficulty seeing down when walking while wearing a mask, contributing to falls); denies ear pain, sore throat, breast concerns, chest pain, palpitations, dizziness, syncope, dyspnea on exertion, cough, swelling, nausea, vomiting, constipation, abdominal pain, melena, hematochezia, indigestion/heartburn(controlled with Dexilant, occasionally gets when takes it qod), hematuria, dysuria, vaginal bleeding, discharge, odor or itch, genital lesions, numbness, tingling, weakness, tremor, suspicious skin lesions, depression, anxiety, abnormal bleeding/bruising, or enlarged lymph nodes.  Chronically bruises easily, unchanged. urinary urgency, no longer having incontinence. decreased hearing, has bilateral hearing aids. Slight shortness of breath while at altitude (hiking) in the past (not recent), short-lived.  No problems in her regular aerobic classes or any other activities/exertion. R  knee pain since fall earlier this year. +snoring (she has woken herself up snoring)--feels refreshed in the mornings, no daytime somnolence.   PHYSICAL EXAM:  BP 102/74   Pulse 68   Temp (!) 96.2 F (35.7 C) (Other (Comment))   Ht _0  (1.676 m)   Wt 170 lb 3.2 oz (77.2 kg)   BMI 27.47 kg/m   Wt Readings from Last 3 Encounters:  09/02/19 170 lb 3.2 oz (77.2 kg)  08/28/18 165 lb 9.6 oz (75.1 kg)  08/21/17 162 lb 9.6 oz (73.8 kg)    General Appearance:  Alert, cooperative, no distress, appears stated  age   Head:  Normocephalic, without obvious abnormality, atraumatic   Eyes:  PERRL, conjunctiva/corneas clear, EOM's intact, fundi benign.   Ears:  Normal TM's and external ear canals   Nose:  Not examined, wearing mask due to COVID-19 pandemic  Throat:  Not examined, wearing mask due to COVID-19 pandemic  Neck:  Supple, no lymphadenopathy; thyroid: no enlargement/ tenderness/nodules; no carotid bruit or JVD   Back:  Spine nontender, no curvature, ROM normal, no CVA tenderness   Lungs:  Clear to auscultation bilaterally without wheezes, rales or ronchi; respirations unlabored   Chest Wall:  No tenderness or deformity   Heart:  Regular rate and rhythm, S1 and S2 normal, no murmur, rub or gallop   Breast Exam:  No tenderness, masses, or nipple discharge or inversion. No axillary lymphadenopathy   Abdomen:  Soft, non-tender, nondistended, active bowel sounds, no masses, no hepatosplenomegaly   Genitalia:  Normal external genitalia without lesions.+atrophic changes noted at introitus.BUS and vagina normal; Uterus absent; and adnexa not enlarged, not palpable, nontender, no masses. Pap not performed   Rectal:  Normal tone, no masses or tenderness;heme negative stool  Extremities:  No clubbing, cyanosis or edema.   Pulses:  2+ and symmetric all extremities   Skin:  Skin color, texture, turgor normal, no rashes or lesions.   Lymph nodes:  Cervical, supraclavicular, and axillary nodes normal   Neurologic:  Normal strength, sensation and gait; reflexes 2+ and symmetric throughout   Psych: Normal mood, affect, hygiene and grooming    ASSESSMENT/PLAN:  Annual physical exam - Plan: CBC with Differential/Platelet, Comprehensive metabolic panel, VITAMIN D 25 Hydroxy (Vit-D Deficiency, Fractures)  Medicare annual wellness visit, subsequent  Vitamin D deficiency - Due for recheck. Counseled re: recommendations - Plan: VITAMIN D 25 Hydroxy  (Vit-D Deficiency, Fractures)  Osteoporosis, unspecified osteoporosis type, unspecified pathological fracture presence - continue Prolia; discussed Ca, D and weight-bearing exercise.  Recheck DEXA next year  Hypothyroidism, acquired - adequately replaced, per pt, under care of Dr. Chalmers Cater (no records).  clinically euthyroid  Medication monitoring encounter - Plan: CBC with Differential/Platelet, Comprehensive metabolic panel, VITAMIN D 25 Hydroxy (Vit-D Deficiency, Fractures)  Gastroesophageal reflux disease - counseled re: diet, behavior, trying to use lowest effective dose, okay to use daily when needed. - Plan: dexlansoprazole (DEXILANT) 60 MG capsule   c-met, CBC, D  Discussed monthly self breast exams and yearly mammograms; at least 30 minutes of aerobic activity at least 5 days/week, weight bearing exercise at least 2x/wk; proper sunscreen use reviewed; healthy diet, including goals of calcium and vitamin D intake and alcohol recommendations (less than or equal to 1 drink/day) reviewed; regular seatbelt use; changing batteries in smoke detectors. Immunization recommendations discussed--UTD; patient states she had Shingrix at pharmacy and will get [place] the dates to put in her chart. Colonoscopy recommendations reviewed--UTD.  She was asked to get [place]  copies of living will and healthcare power of attorney.    She is Full Code, Full Care. MOST formreviewed and updated.    Medicare Attestation I have personally reviewed: The patient's medical and social history Their use of alcohol, tobacco or illicit drugs Their current medications and supplements The patient's functional ability including ADLs,fall risks, home safety risks, cognitive, and hearing and visual impairment Diet and physical activities Evidence for depression or mood disorders  The patient's weight, height, BMI have been recorded in the chart.  I have made referrals, counseling, and provided education to the patient  based on review of the above and I have provided the patient with a written personalized care plan for preventive services.

## 2019-09-02 ENCOUNTER — Ambulatory Visit (INDEPENDENT_AMBULATORY_CARE_PROVIDER_SITE_OTHER): Payer: Medicare Other | Admitting: Family Medicine

## 2019-09-02 ENCOUNTER — Other Ambulatory Visit: Payer: Self-pay

## 2019-09-02 ENCOUNTER — Encounter: Payer: Self-pay | Admitting: Family Medicine

## 2019-09-02 VITALS — BP 102/74 | HR 68 | Temp 96.2°F | Ht 66.0 in | Wt 170.2 lb

## 2019-09-02 DIAGNOSIS — Z0000 Encounter for general adult medical examination without abnormal findings: Secondary | ICD-10-CM

## 2019-09-02 DIAGNOSIS — M810 Age-related osteoporosis without current pathological fracture: Secondary | ICD-10-CM | POA: Diagnosis not present

## 2019-09-02 DIAGNOSIS — E039 Hypothyroidism, unspecified: Secondary | ICD-10-CM | POA: Diagnosis not present

## 2019-09-02 DIAGNOSIS — K219 Gastro-esophageal reflux disease without esophagitis: Secondary | ICD-10-CM

## 2019-09-02 DIAGNOSIS — Z5181 Encounter for therapeutic drug level monitoring: Secondary | ICD-10-CM

## 2019-09-02 DIAGNOSIS — E559 Vitamin D deficiency, unspecified: Secondary | ICD-10-CM

## 2019-09-02 MED ORDER — DEXILANT 60 MG PO CPDR: 1.0000 | DELAYED_RELEASE_CAPSULE | Freq: Every day | ORAL | 11 refills | Status: DC

## 2019-09-03 LAB — CBC WITH DIFFERENTIAL/PLATELET
Basophils Absolute: 0.1 10*3/uL (ref 0.0–0.2)
Basos: 1 %
EOS (ABSOLUTE): 0.1 10*3/uL (ref 0.0–0.4)
Eos: 2 %
Hematocrit: 43.5 % (ref 34.0–46.6)
Hemoglobin: 14.2 g/dL (ref 11.1–15.9)
Immature Grans (Abs): 0 10*3/uL (ref 0.0–0.1)
Immature Granulocytes: 1 %
Lymphocytes Absolute: 1.4 10*3/uL (ref 0.7–3.1)
Lymphs: 20 %
MCH: 28.9 pg (ref 26.6–33.0)
MCHC: 32.6 g/dL (ref 31.5–35.7)
MCV: 88 fL (ref 79–97)
Monocytes Absolute: 0.6 10*3/uL (ref 0.1–0.9)
Monocytes: 8 %
Neutrophils Absolute: 4.9 10*3/uL (ref 1.4–7.0)
Neutrophils: 68 %
Platelets: 345 10*3/uL (ref 150–450)
RBC: 4.92 x10E6/uL (ref 3.77–5.28)
RDW: 13.3 % (ref 11.7–15.4)
WBC: 7.1 10*3/uL (ref 3.4–10.8)

## 2019-09-03 LAB — COMPREHENSIVE METABOLIC PANEL
ALT: 27 IU/L (ref 0–32)
AST: 30 IU/L (ref 0–40)
Albumin/Globulin Ratio: 1.7 (ref 1.2–2.2)
Albumin: 4.5 g/dL (ref 3.7–4.7)
Alkaline Phosphatase: 150 IU/L — ABNORMAL HIGH (ref 39–117)
BUN/Creatinine Ratio: 22 (ref 12–28)
BUN: 19 mg/dL (ref 8–27)
Bilirubin Total: 0.5 mg/dL (ref 0.0–1.2)
CO2: 23 mmol/L (ref 20–29)
Calcium: 9.4 mg/dL (ref 8.7–10.3)
Chloride: 103 mmol/L (ref 96–106)
Creatinine, Ser: 0.87 mg/dL (ref 0.57–1.00)
GFR calc Af Amer: 78 mL/min/{1.73_m2} (ref >59)
GFR calc non Af Amer: 67 mL/min/{1.73_m2} (ref >59)
Globulin, Total: 2.6 g/dL (ref 1.5–4.5)
Glucose: 92 mg/dL (ref 65–99)
Potassium: 4.9 mmol/L (ref 3.5–5.2)
Sodium: 143 mmol/L (ref 134–144)
Total Protein: 7.1 g/dL (ref 6.0–8.5)

## 2019-09-03 LAB — VITAMIN D 25 HYDROXY (VIT D DEFICIENCY, FRACTURES): Vit D, 25-Hydroxy: 30.2 ng/mL (ref 30.0–100.0)

## 2019-09-09 ENCOUNTER — Other Ambulatory Visit: Payer: Self-pay | Admitting: Family Medicine

## 2019-09-09 DIAGNOSIS — R748 Abnormal levels of other serum enzymes: Secondary | ICD-10-CM

## 2019-10-26 DIAGNOSIS — H401131 Primary open-angle glaucoma, bilateral, mild stage: Secondary | ICD-10-CM | POA: Diagnosis not present

## 2019-11-30 ENCOUNTER — Telehealth: Payer: Self-pay | Admitting: Family Medicine

## 2019-11-30 NOTE — Telephone Encounter (Signed)
Left message for pt to call concerning Prolia .  

## 2019-12-31 ENCOUNTER — Other Ambulatory Visit: Payer: Medicare Other

## 2019-12-31 ENCOUNTER — Other Ambulatory Visit: Payer: Self-pay

## 2019-12-31 DIAGNOSIS — M810 Age-related osteoporosis without current pathological fracture: Secondary | ICD-10-CM

## 2019-12-31 DIAGNOSIS — R748 Abnormal levels of other serum enzymes: Secondary | ICD-10-CM

## 2019-12-31 MED ORDER — DENOSUMAB 60 MG/ML ~~LOC~~ SOSY: 60.0000 mg | PREFILLED_SYRINGE | Freq: Once | SUBCUTANEOUS | Status: AC

## 2020-02-15 DIAGNOSIS — H00022 Hordeolum internum right lower eyelid: Secondary | ICD-10-CM | POA: Diagnosis not present

## 2020-02-22 DIAGNOSIS — H401112 Primary open-angle glaucoma, right eye, moderate stage: Secondary | ICD-10-CM | POA: Diagnosis not present

## 2020-03-03 ENCOUNTER — Other Ambulatory Visit: Payer: Self-pay

## 2020-03-08 ENCOUNTER — Encounter: Payer: Self-pay | Admitting: Family Medicine

## 2020-03-10 ENCOUNTER — Other Ambulatory Visit: Payer: Self-pay

## 2020-03-10 ENCOUNTER — Other Ambulatory Visit: Payer: Medicare Other

## 2020-03-10 DIAGNOSIS — R748 Abnormal levels of other serum enzymes: Secondary | ICD-10-CM

## 2020-03-14 LAB — HEPATIC FUNCTION PANEL
ALT: 14 IU/L (ref 0–32)
AST: 19 IU/L (ref 0–40)
Albumin: 4.2 g/dL (ref 3.7–4.7)
Alkaline Phosphatase: 140 IU/L — ABNORMAL HIGH (ref 44–121)
Bilirubin Total: 0.4 mg/dL (ref 0.0–1.2)
Bilirubin, Direct: 0.13 mg/dL (ref 0.00–0.40)
Total Protein: 6.6 g/dL (ref 6.0–8.5)

## 2020-03-14 LAB — ALKALINE PHOSPHATASE, ISOENZYMES
BONE FRACTION: 19 % (ref 14–68)
INTESTINAL FRAC.: 5 % (ref 0–18)
LIVER FRACTION: 76 % (ref 18–85)

## 2020-04-26 DIAGNOSIS — H00021 Hordeolum internum right upper eyelid: Secondary | ICD-10-CM | POA: Diagnosis not present

## 2020-05-12 DIAGNOSIS — H0011 Chalazion right upper eyelid: Secondary | ICD-10-CM | POA: Diagnosis not present

## 2020-05-18 DIAGNOSIS — H401131 Primary open-angle glaucoma, bilateral, mild stage: Secondary | ICD-10-CM | POA: Diagnosis not present

## 2020-06-08 DIAGNOSIS — D485 Neoplasm of uncertain behavior of skin: Secondary | ICD-10-CM | POA: Diagnosis not present

## 2020-06-08 DIAGNOSIS — D235 Other benign neoplasm of skin of trunk: Secondary | ICD-10-CM | POA: Diagnosis not present

## 2020-06-08 DIAGNOSIS — L821 Other seborrheic keratosis: Secondary | ICD-10-CM | POA: Diagnosis not present

## 2020-06-08 DIAGNOSIS — L578 Other skin changes due to chronic exposure to nonionizing radiation: Secondary | ICD-10-CM | POA: Diagnosis not present

## 2020-06-08 DIAGNOSIS — D225 Melanocytic nevi of trunk: Secondary | ICD-10-CM | POA: Diagnosis not present

## 2020-06-08 DIAGNOSIS — L570 Actinic keratosis: Secondary | ICD-10-CM | POA: Diagnosis not present

## 2020-06-15 DIAGNOSIS — C73 Malignant neoplasm of thyroid gland: Secondary | ICD-10-CM | POA: Diagnosis not present

## 2020-06-15 DIAGNOSIS — E890 Postprocedural hypothyroidism: Secondary | ICD-10-CM | POA: Diagnosis not present

## 2020-06-17 ENCOUNTER — Telehealth: Payer: Self-pay | Admitting: Family Medicine

## 2020-06-17 NOTE — Telephone Encounter (Signed)
Left message for pt to call. Barbara Parker needs to speak to her concerning Prolia

## 2020-06-23 DIAGNOSIS — C73 Malignant neoplasm of thyroid gland: Secondary | ICD-10-CM | POA: Diagnosis not present

## 2020-06-23 DIAGNOSIS — E890 Postprocedural hypothyroidism: Secondary | ICD-10-CM | POA: Diagnosis not present

## 2020-06-24 DIAGNOSIS — H401131 Primary open-angle glaucoma, bilateral, mild stage: Secondary | ICD-10-CM | POA: Diagnosis not present

## 2020-07-11 ENCOUNTER — Other Ambulatory Visit: Payer: Medicare Other

## 2020-07-11 DIAGNOSIS — Z1231 Encounter for screening mammogram for malignant neoplasm of breast: Secondary | ICD-10-CM | POA: Diagnosis not present

## 2020-07-11 LAB — HM MAMMOGRAPHY

## 2020-07-14 ENCOUNTER — Encounter: Payer: Self-pay | Admitting: Family Medicine

## 2020-07-18 ENCOUNTER — Other Ambulatory Visit: Payer: Medicare Other

## 2020-07-18 ENCOUNTER — Other Ambulatory Visit: Payer: Self-pay

## 2020-07-18 DIAGNOSIS — M810 Age-related osteoporosis without current pathological fracture: Secondary | ICD-10-CM | POA: Diagnosis not present

## 2020-07-18 MED ORDER — DENOSUMAB 60 MG/ML ~~LOC~~ SOSY
60.0000 mg | PREFILLED_SYRINGE | Freq: Once | SUBCUTANEOUS | Status: AC
  Administered 2020-07-18: 60 mg via SUBCUTANEOUS

## 2020-07-25 ENCOUNTER — Encounter: Payer: Self-pay | Admitting: Family Medicine

## 2020-07-25 ENCOUNTER — Ambulatory Visit (INDEPENDENT_AMBULATORY_CARE_PROVIDER_SITE_OTHER): Payer: Medicare Other | Admitting: Family Medicine

## 2020-07-25 ENCOUNTER — Other Ambulatory Visit: Payer: Self-pay

## 2020-07-25 VITALS — BP 110/76 | HR 64 | Ht 66.0 in | Wt 175.0 lb

## 2020-07-25 DIAGNOSIS — R682 Dry mouth, unspecified: Secondary | ICD-10-CM

## 2020-07-25 DIAGNOSIS — M79601 Pain in right arm: Secondary | ICD-10-CM | POA: Diagnosis not present

## 2020-07-25 DIAGNOSIS — R202 Paresthesia of skin: Secondary | ICD-10-CM | POA: Diagnosis not present

## 2020-07-25 DIAGNOSIS — E039 Hypothyroidism, unspecified: Secondary | ICD-10-CM

## 2020-07-25 NOTE — Progress Notes (Signed)
Chief Complaint  Patient presents with  . Numbness    Right arm tingling that started in her sleep last night. Her mouth has been really really dry over the past few days-her thyroid has been off though and is seeing Dr.Balan next week. Also has disk issues in her neck. Has has a frontal HA over the past few days as well-gone today.     She woke up in the middle of the night last night, and the entire R arm was tingling.  Usually she can move her arm around and it resolves.  This time it didn't go away quickly--lasted about 7 hours. Went away completely, but occasionally during the day she feels a slight tingling in the R arm, mainly upper arm, not into the hands (that was only during the night).  Denies weakness, clumsiness or other neuro symptoms.  Tingling slightly along the back of the upper arm (triceps area) currently, comes and goes.  She has h/o bulging disk in her neck, but doesn't have the neck pain like she did in the past (and didn't have any numbness at that time).  Over the weekend she had a frontal HA; didn't have one last night or this morning.  Her mouth was very dry over the weekend.  Has been drinking more water and chewing gum, which has helped some.  Patient has hypothyroidism.  She was told her "thyroid is off" based on the last labs done by Dr. Chalmers Cater.  Dr. Chalmers Cater had wondered if last prescription had been changed to generic, in error. She double checked with her pharmacy, and has stayed on branded Synthroid.  She is due for recheck in 1.5 weeks. Other than complaints above, she denies other complaints or thyroid symptoms   PMH, PSH, SH reviewed  Outpatient Encounter Medications as of 07/25/2020  Medication Sig Note  . brimonidine (ALPHAGAN P) 0.1 % SOLN Place 1 drop into both eyes every 8 (eight) hours.   . cholecalciferol (VITAMIN D) 1000 units tablet Take 2,000 Units by mouth daily.  09/02/2019: Taking 2000 IU daily, but misses dose about once a week  . latanoprost  (XALATAN) 0.005 % ophthalmic solution Place 1 drop into the left eye at bedtime.   Marland Kitchen levothyroxine (SYNTHROID) 112 MCG tablet Take 112 mcg by mouth daily before breakfast. 07/25/2020: Lemont Fillers (from Dr. Chalmers Cater)  . omeprazole (PRILOSEC) 20 MG capsule Take 20 mg by mouth 2 (two) times daily before a meal.   . zolpidem (AMBIEN) 5 MG tablet Take 1 tablet (5 mg total) by mouth at bedtime as needed for sleep. (Patient not taking: No sig reported) 08/28/2018: Still has 20 at home; only very rarely used it, >6 months, possibly 1 year.  . [DISCONTINUED] dexlansoprazole (DEXILANT) 60 MG capsule Take 1 capsule (60 mg total) by mouth daily.    Facility-Administered Encounter Medications as of 07/25/2020  Medication  . denosumab (PROLIA) injection 60 mg   Allergies  Allergen Reactions  . Codeine Anaphylaxis  . Reclast [Zoledronic Acid] Diarrhea and Nausea And Vomiting    Joint pain  . Prednisone     Nausea and vomiting   ROS:  Frontal HA over weekend, none currently.  No URI symptoms, cough. No chest pain, shortness of breath, palpitations. Dry mouth (chewing gum helps) No constipation, diarrhea, nausea, vomiting. No urinary complaints No bleeding, bruising, rashes No memory concerns, trouble thinking. R arm discomfort and numbness per HPI   PHYSICAL EXAM:  BP 110/76   Pulse 64   Ht  5\' 6"  (1.676 m)   Wt 175 lb (79.4 kg)   BMI 28.25 kg/m   Wt Readings from Last 3 Encounters:  07/25/20 175 lb (79.4 kg)  09/02/19 170 lb 3.2 oz (77.2 kg)  08/28/18 165 lb 9.6 oz (75.1 kg)   Pleasant, well-appearing female, in no distress HEENT: conjunctiva and sclera are clear, EOMI, fundi benign. Neck: no lymphadenopathy, thyromegaly or carotid bruit. No c-spine tenderness or muscle spasm Heart: regular rate and rhythm Lungs: clear bilaterally Back: no spinal or CVA tenderness Extremities: no edema. Some discomfort in R deltoid area with deltoid strength testing, and also with arm position for Phalen  testing. Phalen negative. nontender over ulnar nerve, no tingling on palpation. Neuro: alert and oriented, cranial 2-12 intact. Normal strength, DTR's, sensation. Negative Tinel and Phalen. She had slight tingling at the wrist after completing full neuro exam (NOT present after Phalen or Tinel).  Sensation intact to LT and temp. Normal gait.  Negative Romberg. Normal tandem gait. No pronator drift.  Normal finger to nose.  ASSESSMENT/PLAN:  Arm paresthesia, right - prolonged, but resolved. Normal neuro exam. Ddx reviewed. f/u if recurrent sx, or other neuro sx develop  Right arm pain - mild, in deltoid area.  Can try moist heat and OTC NSAIDs for up to a week  Hypothyroidism, acquired - TSH abnl per pt (can't recall if high or low), due for recheck in 1.5 weeks. No significant sympoms related to thyroid  Dry mouth - Ddx discussed.  To stay well hydrated, use lozenges, can try Biotene Mist. To check with ophtho if related to new eye drops   I spent 36 minutes dedicated to the care of this patient, including pre-visit review of records, face to face time, post-visit ordering of testing and documentation.   Your neurologic exam is normal. You seem to have discomfort over the right upper arm, related to the deltoid muscle.  Nothing seems to be involving nerves, no weakness, no evidence of any stroke. Try Aleve twice daily, and maybe some moist heat to the right shoulder/upper arm. Let us know if you develop any new symptoms--any recurrent tingling, any weakness, trouble with dexterity.  It is possible that if they thyroid is really out of whack, it could contribute. Consider letting her know, and possibly get re-tested sooner if symptoms are persisting.  You can try Biotene mist for dry mouth, use sugar-free lozenges.  Consider touching base with your eye doctor to see if your new eye drop can contribute to this. Stay well hydrated, drink lots of water.  Limit caffeine and alcohol.

## 2020-07-25 NOTE — Patient Instructions (Signed)
Your neurologic exam is normal. You seem to have discomfort over the right upper arm, related to the deltoid muscle.  Nothing seems to be involving nerves, no weakness, no evidence of any stroke. Try Aleve twice daily, and maybe some moist heat to the right shoulder/upper arm. Let us know if you develop any new symptoms--any recurrent tingling, any weakness, trouble with dexterity.  It is possible that if they thyroid is really out of whack, it could contribute. Consider letting her know, and possibly get re-tested sooner if symptoms are persisting.  You can try Biotene mist for dry mouth, use sugar-free lozenges.  Consider touching base with your eye doctor to see if your new eye drop can contribute to this. Stay well hydrated, drink lots of water.  Limit caffeine and alcohol.

## 2020-07-27 DIAGNOSIS — H401131 Primary open-angle glaucoma, bilateral, mild stage: Secondary | ICD-10-CM | POA: Diagnosis not present

## 2020-08-11 ENCOUNTER — Telehealth: Payer: Self-pay | Admitting: Family Medicine

## 2020-08-11 ENCOUNTER — Telehealth: Payer: Self-pay

## 2020-08-11 NOTE — Telephone Encounter (Signed)
For review. [place]

## 2020-08-11 NOTE — Progress Notes (Signed)
For review [facility]

## 2020-08-23 DIAGNOSIS — E890 Postprocedural hypothyroidism: Secondary | ICD-10-CM | POA: Diagnosis not present

## 2020-09-04 DIAGNOSIS — H4010X0 Unspecified open-angle glaucoma, stage unspecified: Secondary | ICD-10-CM | POA: Insufficient documentation

## 2020-09-04 NOTE — Progress Notes (Signed)
Chief Complaint  Patient presents with  . Medicare Wellness    Fasting AWV/CPE with pelvic. Sees eye doctor annually. I will fax order to [facility] for DEXA as she does not have one scheduled.     Barbara Parker is a 75 y.o. female who presents for annual physical exam, Medicare wellness visit and follow-up on chronic medical conditions.  She was seen in February with prolonged episode of R arm numbness.  No further episodes, denies numbness/weakness.  Osteoporosis: She has been getting Prolia injections and tolerating without side effects. Last injection was 07/18/20. She had DEXA 12/2016which showed osteoporosis in the spine, T-3.4. She had Reclast infusion on 07/05/15, followed by vomiting and diarrhea and severe aches in the joints (pain lasted up to 2 weeks).  Last DEXA was 06/2018, showing significant improvement in all areas, lowest measured area was spine, T-2.6. Due for DEXA. She has yogurt, cheese, milk daily (2 servings/day).  Gets weight-bearing exercise regularly (either at the gym or weights at home).  Vitamin D deficiency: Last level was 30.2 in 08/2019 when taking 2000 IU daily, but missing a dose about once/week.  She is currently taking it only about 3x/week, can't get into a regular routine.  Hypothyroidism: She isunder the care of Dr. Chalmers Cater. No notes/labs received. She had reported in February that thyroid was "off", and had f/u labs done recently, and dose was changed from 112 to 135mcg.  She hasn't picked this up from the pharmacy yet. Denies changes to hair/nails/skin/bowels/energy/moods. Texture of hair seems coarser, wiry.  Denies hair loss/thinning.  GERD: She used to take Dexilant qod (due to cost), but if she has a flare would use it daily for a few days.  Since February, she switched to omeprazole $RemoveBefor'20mg'WqCQgPvswLGl$  daily, which seems to be working well.  Decreased hearing in the right > left ears. She saw audiologist at Dr. Janeice Robinson office in the past, and hashearing aids for  both ears. Denies changes to her hearing.  Urinary urge incontinence--Isn't as bothersome as in the past, as long as she remembers to void more frequently.Denies leakage with cough/sneeze. Denies dysuria, hematuria. Up 0-1x/night to void.  R knee pain--saw Dr. Rhona Raider, who did x-rays, treated with NSAIDs. This resolved (was related to a fall last year).  Last year she reported 4 falls, much of which she related to wearing her mask (affecting her vision of the ground--tripping).  She also has decreased peripheral vision, under care of Dr. [place] (on treatment for glaucoma). This year hadn't had any falls until her sneaker got "stuck" on carpet at work last week. There was no injury at all.  Glaucoma, under the care of Dr. 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   Immunization History  Administered Date(s) Administered  . Influenza Split 04/09/2011, 03/25/2012, 03/05/2019  . Influenza, High Dose Seasonal PF 03/16/2013, 03/29/2015, 02/22/2016, 03/05/2017, 03/21/2018, 03/09/2020  . PFIZER(Purple Top)SARS-COV-2 Vaccination 07/09/2019, 07/30/2019, 03/09/2020  . Pneumococcal Conjugate-13 03/16/2013  . Pneumococcal Polysaccharide-23 04/20/2015  . Tdap 04/09/2011  . Zoster 05/15/2011  . Zoster Recombinat (Shingrix) 03/05/2019, 05/12/2019   Last Pap smear: 2004; s/p hysterectomy (for fibroids) Last mammogram:07/2020 Last colonoscopy: 04/2012 Dr. Collene Mares  Last DEXA: 06/2018 T-2.6 at spine Dentist: twice yearly  Ophtho: twice yearly Exercise: Resumed her walking 1.5 weeks ago (40 mins 3x/week). Dance exercise classes 1-2x/week.  Does weights at the gym 1-2x/week and uses her weights at home each  evening.  Lipids: Lab Results  Component Value Date   CHOL 220 (H) 08/26/2017   HDL 85 08/26/2017   LDLCALC 121 (H) 08/26/2017   TRIG 68 08/26/2017   CHOLHDL 2.6 08/26/2017    Other  doctors caring for patient include: Ophtho: Dr. [place] Dentist: Dr. Altamese  GI: Dr. Collene Mares Endocrinologist: Dr. Chalmers Cater Ortho: Dr. Maxie Better (stress fracture R foot after fall 11/2014), Dr. Rhona Raider (knee) ENT: Dr. Constance Holster (for broken nose from fall 11/2014, hearing loss) Derm: Dr.Haverstock(prn only) Podiatrist: Dr. Paulla Dolly  Depression screen: Negative  Fall screen: tripped last week, no injuries (tennis shoe just got "stuck" on carpet at work--doesn't usually wear tennis shoes there).  Otherwise no falls in the past year. Functional Status Survey: Unremarkable (wears hearing aids) Mini-Cog screen:5 (normal) See full screens in Epic  End of Life Discussion: Patient hasa living will and medical power of attorney; copies have not been received  PMH, PSH, SH and FH were reviewed and updated  Outpatient Encounter Medications as of 09/05/2020  Medication Sig Note  . brimonidine-timolol (COMBIGAN) 0.2-0.5 % ophthalmic solution Apply 1 drop to eye 2 (two) times daily.   . cholecalciferol (VITAMIN D) 1000 units tablet Take 2,000 Units by mouth daily.  09/05/2020: Takes about 2-3 times a week  . latanoprost (XALATAN) 0.005 % ophthalmic solution Place 1 drop into the left eye at bedtime.   Marland Kitchen levothyroxine (SYNTHROID) 112 MCG tablet Take 112 mcg by mouth daily before breakfast. 09/05/2020: On BRAND Synthroid; dose recently changed to 11mcg, hasn't started yet.  Marland Kitchen omeprazole (PRILOSEC) 20 MG capsule Take 20 mg by mouth 2 (two) times daily before a meal. 09/05/2020: Once daily  . [DISCONTINUED] brimonidine (ALPHAGAN P) 0.1 % SOLN Place 1 drop into both eyes every 8 (eight) hours.   . [DISCONTINUED] zolpidem (AMBIEN) 5 MG tablet Take 1 tablet (5 mg total) by mouth at bedtime as needed for sleep. (Patient not taking: No sig reported) 09/05/2020: Took 3 after loss of her husband.  Still has bottle at home, not using   Facility-Administered Encounter Medications as of 09/05/2020  Medication  . denosumab (PROLIA)  injection 60 mg   Allergies  Allergen Reactions  . Codeine Anaphylaxis  . Reclast [Zoledronic Acid] Diarrhea and Nausea And Vomiting    Joint pain  . Prednisone     Nausea and vomiting     ROS: The patient denies anorexia, fever, vision changes (some decrease in peripheral vision on right); denies ear pain, sore throat, breast concerns, chest pain, palpitations, dizziness, syncope, dyspnea on exertion, cough, swelling, nausea, vomiting, constipation, abdominal pain, melena, hematochezia, indigestion/heartburn, hematuria, dysuria, vaginal bleeding, discharge, odor or itch, genital lesions, numbness, tingling, weakness, tremor, suspicious skin lesions, depression, anxiety, abnormal bleeding/bruising, or enlarged lymph nodes.  Chronically bruises easily, unchanged. Urinary urgency, no longer having incontinence. Decreased hearing, has bilateral hearing aids. +snoring (she has woken herself up snoring in the past, not in a while)--feels refreshed in the mornings, no daytime somnolence. +slight weight gain Reflux is well controlled No further arm tingling.   PHYSICAL EXAM:  BP 130/72   Pulse 60   Ht $R'5\' 6"'Pb$  (1.676 m)   Wt 175 lb 6.4 oz (79.6 kg)   BMI 28.31 kg/m   Wt Readings from Last 3 Encounters:  09/05/20 175 lb 6.4 oz (79.6 kg)  07/25/20 175 lb (79.4 kg)  09/02/19 170 lb 3.2 oz (77.2 kg)   Wt 165# 9.6oz 08/2018   General Appearance:  Alert, cooperative, no distress, appears stated age  Head:  Normocephalic, without obvious abnormality, atraumatic   Eyes:  PERRL, conjunctiva/corneas clear, EOM's intact, fundi benign.   Ears:  Normal TM's and external ear canals   Nose:  Not examined, wearing mask due to COVID-19 pandemic  Throat:  Not examined, wearing mask due to COVID-19 pandemic  Neck:  Supple, no lymphadenopathy; thyroid: no enlargement/ tenderness/nodules; no carotid bruit or JVD   Back:  Spine nontender, no curvature, ROM normal, no CVA tenderness    Lungs:  Clear to auscultation bilaterally without wheezes, rales or ronchi; respirations unlabored   Chest Wall:  No tenderness or deformity   Heart:  Regular rate and rhythm, S1 and S2 normal, no murmur, rub or gallop   Breast Exam:  No tenderness, masses, or nipple discharge or inversion. No axillary lymphadenopathy   Abdomen:  Soft, non-tender, nondistended, active bowel sounds, no masses, no hepatosplenomegaly   Genitalia:  Normal external genitalia without lesions.+atrophic changes noted at introitus.BUS and vagina normal; Uterus absent; and adnexa not enlarged, not palpable, nontender, no masses. Pap not performed   Rectal:  Normal tone, no masses or tenderness;no stool in vault to heme test.  Extremities:  No clubbing, cyanosis or edema.   Pulses:  2-3+ and symmetric all extremities   Skin:  Skin color, texture, turgor normal, no rashes or lesions.  She had a small splinter in R palm--she was actually able to get it out during the visit herself.  Lymph nodes:  Cervical, supraclavicular, and axillary nodes normal   Neurologic:  Normal strength, sensation and gait; reflexes 2+ and symmetric throughout   Psych:   Normal mood, affect, hygiene and grooming    ASSESSMENT/PLAN:  Offer COVID booster--will come back on a Thurs DEXA due--PLEASE FAX ORDER TO [facility] if pt states she hasn't had or not scheduled for it (was due 07/2020).  F/u osteoporosis  TdaP from pharmacy  c-met, cbc, D   Discussed monthly self breast exams and yearly mammograms; at least 30 minutes of aerobic activity at least 5 days/week, weight bearing exercise at least 2x/wk; proper sunscreen use reviewed; healthy diet, including goals of calcium and vitamin D intake and alcohol recommendations (less than or equal to 1 drink/day) reviewed; regular seatbelt use; changing batteries in smoke detectors. Immunization recommendations discussed--Tdap due this year, to get from pharmacy.   COVID booster (2nd) discussed, will return for [place] when convenient (aware needs to get these vaccines at least 2 weeks apart) Colonoscopy recommendations reviewed--UTD.  She was asked to get [place] copies of living will and healthcare power of attorney.    MOST formreviewed and updated. Full Code, Full Care.    Medicare Attestation I have personally reviewed: The patient's medical and social history Their use of alcohol, tobacco or illicit drugs Their current medications and supplements The patient's functional ability including ADLs,fall risks, home safety risks, cognitive, and hearing and visual impairment Diet and physical activities Evidence for depression or mood disorders  The patient's weight, height, BMI have been recorded in the chart.  I have made referrals, counseling, and provided education to the patient based on review of the above and I have provided the patient with a written personalized care plan for preventive services.

## 2020-09-04 NOTE — Patient Instructions (Addendum)
HEALTH MAINTENANCE RECOMMENDATIONS:  It is recommended that you get at least 30 minutes of aerobic exercise at least 5 days/week (for weight loss, you may need as much as 60-90 minutes). This can be any activity that gets your heart rate up. This can be divided in 10-15 minute intervals if needed, but try and build up your endurance at least once a week.  Weight bearing exercise is also recommended twice weekly.  Eat a healthy diet with lots of vegetables, fruits and fiber.  "Colorful" foods have a lot of vitamins (ie green vegetables, tomatoes, red peppers, etc).  Limit sweet tea, regular sodas and alcoholic beverages, all of which has a lot of calories and sugar.  Up to 1 alcoholic drink daily may be beneficial for women (unless trying to lose weight, watch sugars).  Drink a lot of water.  Calcium recommendations are 1200-1500 mg daily (1500 mg for postmenopausal women or women without ovaries), and vitamin D 1000 IU daily.  This should be obtained from diet and/or supplements (vitamins), and calcium should not be taken all at once, but in divided doses.  Monthly self breast exams and yearly mammograms for women over the age of 65 is recommended.  Sunscreen of at least SPF 30 should be used on all sun-exposed parts of the skin when outside between the hours of 10 am and 4 pm (not just when at beach or pool, but even with exercise, golf, tennis, and yard work!)  Use a sunscreen that says "broad spectrum" so it covers both UVA and UVB rays, and make sure to reapply every 1-2 hours.  Remember to change the batteries in your smoke detectors when changing your clock times in the spring and fall. Carbon monoxide detectors are recommended for your home.  Use your seat belt every time you are in a car, and please drive safely and not be distracted with cell phones and texting while driving.   [name] , Thank you for taking time to come for your Medicare Wellness Visit. I appreciate your ongoing  commitment to your health goals. Please review the following plan we discussed and let me know if I can assist you in the future.    This is a list of the screening recommended for you and due dates:  Health Maintenance  Topic Date Due  . COVID-19 Vaccine (4 - Booster for Pfizer series) 09/07/2020  . Flu Shot  01/02/2021  . Tetanus Vaccine  04/08/2021  . Colon Cancer Screening  04/11/2022  . Mammogram  07/11/2022  . DEXA scan (bone density measurement)  Completed  .  Hepatitis C: One time screening is recommended by Center for Disease Control  (CDC) for  adults born from [birth year] through 1963.   Completed  . Pneumonia vaccines  Completed  . HPV Vaccine  Aged Out    Tetanus booster (TdaP) is due this year.  You need to get this from your pharmacy. Be sure that this is separated from your COVID booster by at least 2 weeks.  Please get [place] copies of your living will and healthcare power of attorney at your convenience, so they can be scanned into your chart.  Your are due for 2 year follow-up bone density test.  Please schedule this with [facility] (we faxed order today).  Continue Prolia injections every 6 months.  Please take your vitamin D when you take your reflux medications.  This should help you remember more (it doesn't matter when you take it--just not with your  Synthroid; if you miss a day, you can double up the next day).   Calcium Content in Foods Calcium is the most abundant mineral in the body. Most of the body's calcium supply is stored in bones and teeth. Calcium helps many parts of the body function normally, including:  Blood and blood vessels.  Nerves.  Hormones.  Muscles.  Bones and teeth. When your calcium stores are low, you may be at risk for low bone mass, bone loss, and broken bones (fractures). When you get enough calcium, it helps to support strong bones and teeth throughout your life. Calcium is especially important for:  Children during growth  spurts.  Girls during adolescence.  Women who are pregnant or breastfeeding.  Women after their menstrual cycle stops (postmenopause).  Women whose menstrual cycle has stopped due to anorexia nervosa or regular intense exercise.  People who cannot eat or digest dairy products.  [place]eg[place]. Recommended daily amounts of calcium:  Women (ages 17 to 48): 1,000 mg per day.  Women (ages 73 and older): 1,200 mg per day.  Men (ages 72 to 18): 1,000 mg per day.  Men (ages 62 and older): 1,200 mg per day.  Women (ages 47 to 66): 1,300 mg per day.  Men (ages 57 to 76): 1,300 mg per day. General information  Eat foods that are high in calcium. Try to get most of your calcium from food.  Some people may benefit from taking calcium supplements. Check with your health care provider or diet and nutrition specialist (dietitian) before starting any calcium supplements. Calcium supplements may interact with certain medicines. Too much calcium may cause other health problems, such as constipation and kidney stones.  For the body to absorb calcium, it needs vitamin D. Sources of vitamin D include: ? Skin exposure to direct sunlight. ? Foods, such as egg yolks, liver, mushrooms, saltwater fish, and fortified milk. ? Vitamin D supplements. Check with your health care provider or dietitian before starting any vitamin D supplements. What foods are high in calcium? Foods that are high in calcium contain more than 100 milligrams per serving. Fruits  Fortified orange juice or other fruit juice, 300 mg per 8 oz serving. Vegetables  Collard greens, 360 mg per 8 oz serving.  Kale, 100 mg per 8 oz serving.  Bok choy, 160 mg per 8 oz serving. Grains  Fortified ready-to-eat cereals, 100 to 1,000 mg per 8 oz serving.  Fortified frozen waffles, 200 mg in 2 waffles.  Oatmeal, 140 mg in 1 cup. Meats and other proteins  Sardines, canned with bones, 325 mg per 3 oz serving.  Salmon, canned with bones,  180 mg per 3 oz serving.  Canned shrimp, 125 mg per 3 oz serving.  Baked beans, 160 mg per 4 oz serving.  Tofu, firm, made with calcium sulfate, 253 mg per 4 oz serving. Dairy  Yogurt, plain, low-fat, 310 mg per 6 oz serving.  Nonfat milk, 300 mg per 8 oz serving.  American cheese, 195 mg per 1 oz serving.  Cheddar cheese, 205 mg per 1 oz serving.  Cottage cheese 2%, 105 mg per 4 oz serving.  Fortified soy, rice, or almond milk, 300 mg per 8 oz serving.  Mozzarella, part skim, 210 mg per 1 oz serving. The items listed above may not be a complete list of foods high in calcium. Actual amounts of calcium may be different depending on processing. Contact a dietitian for more information.   What foods are lower in calcium?  Foods that are lower in calcium contain 50 mg or less per serving. Fruits  Apple, about 6 mg.  Banana, about 12 mg. Vegetables  Lettuce, 19 mg per 2 oz serving.  Tomato, about 11 mg. Grains  Rice, 4 mg per 6 oz serving.  Boiled potatoes, 14 mg per 8 oz serving.  White bread, 6 mg per slice. Meats and other proteins  Egg, 27 mg per 2 oz serving.  Red meat, 7 mg per 4 oz serving.  Chicken, 17 mg per 4 oz serving.  Fish, cod, or trout, 20 mg per 4 oz serving. Dairy  Cream cheese, regular, 14 mg per 1 Tbsp serving.  Brie cheese, 50 mg per 1 oz serving.  Parmesan cheese, 70 mg per 1 Tbsp serving. The items listed above may not be a complete list of foods lower in calcium. Actual amounts of calcium may be different depending on processing. Contact a dietitian for more information. Summary  Calcium is an important mineral in the body because it affects many functions. Getting enough calcium helps support strong bones and teeth throughout your life.  Try to get most of your calcium from food.  Calcium supplements may interact with certain medicines. Check with your health care provider or dietitian before starting any calcium supplements. This  information is not intended to replace advice given to you by your health care provider. Make sure you discuss any questions you have with your health care provider. Document Revised: 09/16/2019 Document Reviewed: 09/16/2019 Elsevier Patient Education  [place].

## 2020-09-05 ENCOUNTER — Encounter: Payer: Self-pay | Admitting: Family Medicine

## 2020-09-05 ENCOUNTER — Ambulatory Visit (INDEPENDENT_AMBULATORY_CARE_PROVIDER_SITE_OTHER): Payer: Medicare Other | Admitting: Family Medicine

## 2020-09-05 ENCOUNTER — Other Ambulatory Visit: Payer: Self-pay

## 2020-09-05 VITALS — BP 130/72 | HR 60 | Ht 66.0 in | Wt 175.4 lb

## 2020-09-05 DIAGNOSIS — Z5181 Encounter for therapeutic drug level monitoring: Secondary | ICD-10-CM

## 2020-09-05 DIAGNOSIS — H401192 Primary open-angle glaucoma, unspecified eye, moderate stage: Secondary | ICD-10-CM

## 2020-09-05 DIAGNOSIS — K219 Gastro-esophageal reflux disease without esophagitis: Secondary | ICD-10-CM

## 2020-09-05 DIAGNOSIS — Z0000 Encounter for general adult medical examination without abnormal findings: Secondary | ICD-10-CM | POA: Diagnosis not present

## 2020-09-05 DIAGNOSIS — E559 Vitamin D deficiency, unspecified: Secondary | ICD-10-CM | POA: Diagnosis not present

## 2020-09-05 DIAGNOSIS — Z7185 Encounter for immunization safety counseling: Secondary | ICD-10-CM

## 2020-09-05 DIAGNOSIS — Z6828 Body mass index (BMI) 28.0-28.9, adult: Secondary | ICD-10-CM

## 2020-09-05 DIAGNOSIS — E039 Hypothyroidism, unspecified: Secondary | ICD-10-CM

## 2020-09-05 DIAGNOSIS — M810 Age-related osteoporosis without current pathological fracture: Secondary | ICD-10-CM | POA: Diagnosis not present

## 2020-09-05 LAB — POCT URINALYSIS DIP (PROADVANTAGE DEVICE)
Blood, UA: NEGATIVE
Glucose, UA: NEGATIVE mg/dL
Ketones, POC UA: NEGATIVE mg/dL
Leukocytes, UA: NEGATIVE
Nitrite, UA: NEGATIVE
Protein Ur, POC: NEGATIVE mg/dL
Specific Gravity, Urine: 1.02
Urobilinogen, Ur: NEGATIVE
pH, UA: 6 (ref 5.0–8.0)

## 2020-09-06 LAB — CBC WITH DIFFERENTIAL/PLATELET
Basophils Absolute: 0.1 10*3/uL (ref 0.0–0.2)
Basos: 1 %
EOS (ABSOLUTE): 0.2 10*3/uL (ref 0.0–0.4)
Eos: 2 %
Hematocrit: 39.1 % (ref 34.0–46.6)
Hemoglobin: 13 g/dL (ref 11.1–15.9)
Immature Grans (Abs): 0 10*3/uL (ref 0.0–0.1)
Immature Granulocytes: 0 %
Lymphocytes Absolute: 1.1 10*3/uL (ref 0.7–3.1)
Lymphs: 14 %
MCH: 28.7 pg (ref 26.6–33.0)
MCHC: 33.2 g/dL (ref 31.5–35.7)
MCV: 86 fL (ref 79–97)
Monocytes Absolute: 0.7 10*3/uL (ref 0.1–0.9)
Monocytes: 8 %
Neutrophils Absolute: 6.2 10*3/uL (ref 1.4–7.0)
Neutrophils: 75 %
Platelets: 329 10*3/uL (ref 150–450)
RBC: 4.53 x10E6/uL (ref 3.77–5.28)
RDW: 13.1 % (ref 11.7–15.4)
WBC: 8.3 10*3/uL (ref 3.4–10.8)

## 2020-09-06 LAB — COMPREHENSIVE METABOLIC PANEL
ALT: 14 IU/L (ref 0–32)
AST: 17 IU/L (ref 0–40)
Albumin/Globulin Ratio: 1.9 (ref 1.2–2.2)
Albumin: 4.4 g/dL (ref 3.7–4.7)
Alkaline Phosphatase: 125 IU/L — ABNORMAL HIGH (ref 44–121)
BUN/Creatinine Ratio: 25 (ref 12–28)
BUN: 17 mg/dL (ref 8–27)
Bilirubin Total: 0.5 mg/dL (ref 0.0–1.2)
CO2: 23 mmol/L (ref 20–29)
Calcium: 8.6 mg/dL — ABNORMAL LOW (ref 8.7–10.3)
Chloride: 106 mmol/L (ref 96–106)
Creatinine, Ser: 0.67 mg/dL (ref 0.57–1.00)
Globulin, Total: 2.3 g/dL (ref 1.5–4.5)
Glucose: 87 mg/dL (ref 65–99)
Potassium: 4.7 mmol/L (ref 3.5–5.2)
Sodium: 144 mmol/L (ref 134–144)
Total Protein: 6.7 g/dL (ref 6.0–8.5)
eGFR: 93 mL/min/{1.73_m2} (ref >59)

## 2020-09-06 LAB — VITAMIN D 25 HYDROXY (VIT D DEFICIENCY, FRACTURES): Vit D, 25-Hydroxy: 29.3 ng/mL — ABNORMAL LOW (ref 30.0–100.0)

## 2020-09-26 DIAGNOSIS — H401131 Primary open-angle glaucoma, bilateral, mild stage: Secondary | ICD-10-CM | POA: Diagnosis not present

## 2020-10-07 ENCOUNTER — Ambulatory Visit: Payer: Medicare Other

## 2020-10-11 DIAGNOSIS — M5459 Other low back pain: Secondary | ICD-10-CM | POA: Diagnosis not present

## 2020-10-11 DIAGNOSIS — M542 Cervicalgia: Secondary | ICD-10-CM | POA: Diagnosis not present

## 2020-10-12 DIAGNOSIS — H2513 Age-related nuclear cataract, bilateral: Secondary | ICD-10-CM | POA: Diagnosis not present

## 2020-10-12 DIAGNOSIS — H401432 Capsular glaucoma with pseudoexfoliation of lens, bilateral, moderate stage: Secondary | ICD-10-CM | POA: Diagnosis not present

## 2020-10-13 ENCOUNTER — Encounter: Payer: Self-pay | Admitting: Family Medicine

## 2020-10-13 ENCOUNTER — Other Ambulatory Visit (INDEPENDENT_AMBULATORY_CARE_PROVIDER_SITE_OTHER): Payer: Medicare Other

## 2020-10-13 ENCOUNTER — Telehealth (INDEPENDENT_AMBULATORY_CARE_PROVIDER_SITE_OTHER): Payer: Medicare Other | Admitting: Family Medicine

## 2020-10-13 ENCOUNTER — Other Ambulatory Visit: Payer: Self-pay | Admitting: *Deleted

## 2020-10-13 VITALS — Temp 99.0°F | Ht 66.0 in | Wt 165.0 lb

## 2020-10-13 DIAGNOSIS — R52 Pain, unspecified: Secondary | ICD-10-CM | POA: Diagnosis not present

## 2020-10-13 DIAGNOSIS — R509 Fever, unspecified: Secondary | ICD-10-CM | POA: Diagnosis not present

## 2020-10-13 DIAGNOSIS — U071 COVID-19: Secondary | ICD-10-CM | POA: Diagnosis not present

## 2020-10-13 DIAGNOSIS — R059 Cough, unspecified: Secondary | ICD-10-CM

## 2020-10-13 LAB — POCT INFLUENZA A/B
Influenza A, POC: NEGATIVE
Influenza B, POC: NEGATIVE

## 2020-10-13 LAB — POC COVID19 BINAXNOW: SARS Coronavirus 2 Ag: POSITIVE — AB

## 2020-10-13 MED ORDER — NIRMATRELVIR/RITONAVIR (PAXLOVID)TABLET: 3.0000 | ORAL_TABLET | Freq: Two times a day (BID) | ORAL | 0 refills | Status: AC

## 2020-10-13 MED ORDER — BENZONATATE 200 MG PO CAPS: 200.0000 mg | ORAL_CAPSULE | Freq: Three times a day (TID) | ORAL | 0 refills | Status: DC | PRN

## 2020-10-13 NOTE — Patient Instructions (Signed)
Drink plenty of water. Use Mucinex-DM 12 hour twice daily to help with cough. If not helping enough with cough, then you can also take the benzonatate that was prescribed. You may use tylenol or ibuprofen if needed for pain and fever. Take the Paxlovid as prescribed.  We discussed the need to fully isolate for the first 5 days of illness (with today being day #1), and if respiratory symptoms are improving, you can leave isolation, but remain masked 100% for days 6-10.  If your respiratory symptoms aren't improving, you should isolate for the full 10 days.  You should seek re-evaluation if having persistent/worsening fever, increasing trouble breathing, shortness of breath, chest pain, pain with breathing or other concerns.

## 2020-10-13 NOTE — Progress Notes (Signed)
Start time: 11:28 End time: 11:44  Virtual Visit via Video Note  I connected with Helayne Seminole on 10/13/20 by a video enabled telemedicine application and verified that I am speaking with the correct person using two identifiers.  Location: Patient: home Provider: office   I discussed the limitations of evaluation and management by telemedicine and the availability of in person appointments. The patient expressed understanding and agreed to proceed.  History of Present Illness:  Chief Complaint  Patient presents with  . Cough    VIRTUAL cough and had fever yesterday of 101.2. Chills, body aches yesterday. Been around 2 people that have tested positive for covid. Today has cough and runny nose and chest tightness. Took home test yesterday that was 62.73 years old. Going on vacation next week so wanted to be seen to try to get ahead of this and get medication if needed.    She was with 2 people over the weekend who tested + for COVID earlier this week. She woke up yesterday morning feeling terrible--101.2 fever, achey all over, chills, cough, headache, runny nose. Today her nose is still runny, has a cough.  Chest feels a little tight.  Yesterday afternoon she took a home COVID test, negative, but test was 73 years old. Took advil this morning.  Leaving for vacation this Sunday. Going to [facility] with family.  PMH, PSH, SH reviewed  Outpatient Encounter Medications as of 10/13/2020  Medication Sig Note  . acetaminophen (TYLENOL) 325 MG tablet Take 325 mg by mouth every 6 (six) hours as needed. 10/13/2020: Last dose 6:30am  . brimonidine-timolol (COMBIGAN) 0.2-0.5 % ophthalmic solution Apply 1 drop to eye 2 (two) times daily.   . cholecalciferol (VITAMIN D) 1000 units tablet Take 2,000 Units by mouth daily.  09/05/2020: Takes about 2-3 times a week  . latanoprost (XALATAN) 0.005 % ophthalmic solution Place 1 drop into the left eye at bedtime.   Marland Kitchen levothyroxine (SYNTHROID) 112 MCG tablet Take  112 mcg by mouth daily before breakfast. 09/05/2020: On BRAND Synthroid; dose recently changed to 159mcg, hasn't started yet.  Marland Kitchen omeprazole (PRILOSEC) 20 MG capsule Take 20 mg by mouth 2 (two) times daily before a meal. 09/05/2020: Once daily   Facility-Administered Encounter Medications as of 10/13/2020  Medication  . denosumab (PROLIA) injection 60 mg   Allergies  Allergen Reactions  . Codeine Anaphylaxis  . Reclast [Zoledronic Acid] Diarrhea and Nausea And Vomiting    Joint pain  . Prednisone     Nausea and vomiting   ROS: No n/v/d, bleeding, bruising, rashes. Fever and URI symptoms pre HPI. Some chest tightness, no shortness of breath.     Observations/Objective:  Temp 99 F (37.2 C) (Oral)   Ht 5\' 6"  (1.676 m)   Wt 165 lb (74.8 kg)   BMI 26.63 kg/m   Well-appearing, pleasant female, in no distress. She is only slightly congested, no significant coughing during visit. Speaking easily She is alert, oriented, cranial nerves grossly intact.  Rapid COVID test + Negative Influenza A&B   Assessment and Plan:  COVID-19 - reviewed proper isolation, advised NOT to leave as planned for vacation. Supportive measures, OTC meds and s/sx of complications reviewed - Plan: nirmatrelvir/ritonavir EUA (PAXLOVID) TABS  Cough - Plan: benzonatate (TESSALON) 200 MG capsule   Follow Up Instructions:    I discussed the assessment and treatment plan with the patient. The patient was provided an opportunity to ask questions and all were answered. The patient agreed with the plan  and demonstrated an understanding of the instructions.   The patient was advised to call back or seek an in-person evaluation if the symptoms worsen or if the condition fails to improve as anticipated.  I spent 22 minutes dedicated to the care of this patient, including pre-visit review of records, face to face time, post-visit ordering of testing and documentation.    Vikki Ports, MD

## 2020-10-24 DIAGNOSIS — R42 Dizziness and giddiness: Secondary | ICD-10-CM | POA: Diagnosis not present

## 2020-10-24 DIAGNOSIS — H903 Sensorineural hearing loss, bilateral: Secondary | ICD-10-CM | POA: Diagnosis not present

## 2020-10-24 DIAGNOSIS — H8111 Benign paroxysmal vertigo, right ear: Secondary | ICD-10-CM | POA: Diagnosis not present

## 2020-10-26 DIAGNOSIS — M810 Age-related osteoporosis without current pathological fracture: Secondary | ICD-10-CM | POA: Diagnosis not present

## 2020-10-26 DIAGNOSIS — M8589 Other specified disorders of bone density and structure, multiple sites: Secondary | ICD-10-CM | POA: Diagnosis not present

## 2020-10-26 DIAGNOSIS — E890 Postprocedural hypothyroidism: Secondary | ICD-10-CM | POA: Diagnosis not present

## 2020-10-26 DIAGNOSIS — Z780 Asymptomatic menopausal state: Secondary | ICD-10-CM | POA: Diagnosis not present

## 2020-10-26 DIAGNOSIS — M542 Cervicalgia: Secondary | ICD-10-CM | POA: Diagnosis not present

## 2020-10-26 LAB — HM DEXA SCAN

## 2020-11-01 DIAGNOSIS — H401412 Capsular glaucoma with pseudoexfoliation of lens, right eye, moderate stage: Secondary | ICD-10-CM | POA: Diagnosis not present

## 2020-11-07 ENCOUNTER — Encounter: Payer: Self-pay | Admitting: Family Medicine

## 2020-11-07 ENCOUNTER — Encounter: Payer: Self-pay | Admitting: *Deleted

## 2020-11-08 DIAGNOSIS — H401432 Capsular glaucoma with pseudoexfoliation of lens, bilateral, moderate stage: Secondary | ICD-10-CM | POA: Diagnosis not present

## 2020-11-21 DIAGNOSIS — M542 Cervicalgia: Secondary | ICD-10-CM | POA: Diagnosis not present

## 2020-11-22 DIAGNOSIS — H401422 Capsular glaucoma with pseudoexfoliation of lens, left eye, moderate stage: Secondary | ICD-10-CM | POA: Diagnosis not present

## 2020-11-28 DIAGNOSIS — M5412 Radiculopathy, cervical region: Secondary | ICD-10-CM | POA: Diagnosis not present

## 2020-11-29 DIAGNOSIS — H401432 Capsular glaucoma with pseudoexfoliation of lens, bilateral, moderate stage: Secondary | ICD-10-CM | POA: Diagnosis not present

## 2020-12-16 DIAGNOSIS — H903 Sensorineural hearing loss, bilateral: Secondary | ICD-10-CM | POA: Diagnosis not present

## 2020-12-16 DIAGNOSIS — R42 Dizziness and giddiness: Secondary | ICD-10-CM | POA: Diagnosis not present

## 2020-12-19 DIAGNOSIS — M4722 Other spondylosis with radiculopathy, cervical region: Secondary | ICD-10-CM | POA: Diagnosis not present

## 2020-12-28 DIAGNOSIS — H401113 Primary open-angle glaucoma, right eye, severe stage: Secondary | ICD-10-CM | POA: Diagnosis not present

## 2021-01-09 DIAGNOSIS — E890 Postprocedural hypothyroidism: Secondary | ICD-10-CM | POA: Diagnosis not present

## 2021-01-10 DIAGNOSIS — M4722 Other spondylosis with radiculopathy, cervical region: Secondary | ICD-10-CM | POA: Diagnosis not present

## 2021-01-26 DIAGNOSIS — L650 Telogen effluvium: Secondary | ICD-10-CM | POA: Diagnosis not present

## 2021-01-30 DIAGNOSIS — H401113 Primary open-angle glaucoma, right eye, severe stage: Secondary | ICD-10-CM | POA: Diagnosis not present

## 2021-02-09 ENCOUNTER — Other Ambulatory Visit: Payer: Self-pay

## 2021-02-09 ENCOUNTER — Other Ambulatory Visit: Payer: Medicare Other

## 2021-02-09 DIAGNOSIS — M810 Age-related osteoporosis without current pathological fracture: Secondary | ICD-10-CM | POA: Diagnosis not present

## 2021-02-09 DIAGNOSIS — Z23 Encounter for immunization: Secondary | ICD-10-CM | POA: Diagnosis not present

## 2021-02-09 MED ORDER — DENOSUMAB 60 MG/ML ~~LOC~~ SOSY
60.0000 mg | PREFILLED_SYRINGE | Freq: Once | SUBCUTANEOUS | Status: AC
  Administered 2021-02-09: 60 mg via SUBCUTANEOUS

## 2021-03-29 DIAGNOSIS — H401113 Primary open-angle glaucoma, right eye, severe stage: Secondary | ICD-10-CM | POA: Diagnosis not present

## 2021-04-10 ENCOUNTER — Telehealth: Payer: Self-pay

## 2021-04-10 NOTE — Telephone Encounter (Signed)
Patient called with concerns of tingling in her nose today and last day was nose and mouth tingling. 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 Tomi Bamberger today?  Advised someone would call her back today, phone number [phone]

## 2021-04-10 NOTE — Telephone Encounter (Signed)
Spoke with patient and she will come in on Wed at 1:30, she will be here at 1:15pm. If symptoms persist or worsen before appt she will go to ER.

## 2021-04-12 ENCOUNTER — Other Ambulatory Visit: Payer: Self-pay

## 2021-04-12 ENCOUNTER — Encounter: Payer: Self-pay | Admitting: Family Medicine

## 2021-04-12 ENCOUNTER — Ambulatory Visit (INDEPENDENT_AMBULATORY_CARE_PROVIDER_SITE_OTHER): Payer: Medicare Other | Admitting: Family Medicine

## 2021-04-12 VITALS — BP 120/72 | HR 80 | Ht 66.0 in | Wt 167.4 lb

## 2021-04-12 DIAGNOSIS — R200 Anesthesia of skin: Secondary | ICD-10-CM

## 2021-04-12 DIAGNOSIS — L659 Nonscarring hair loss, unspecified: Secondary | ICD-10-CM | POA: Diagnosis not present

## 2021-04-12 NOTE — Progress Notes (Signed)
Chief Complaint  Patient presents with   Numbness    Numbness and tingling of her face and nose that happened Sunday. Hasn't happened again. Inside if nose is still a little but numb.    Sunday night, while laying in bed, she felt sudden onset of numbness in her face. It was to the right of midline, extending from the upper forehead, down the inner portion of the eye/nose, down to below the lip.  She reports it was a narrow vertical line of numbness/tingling. It lasted 30 minutes.  No associated weakness, no changes in speech, vision or thinking. Numb/tingling sensation only, no associated headache, vision problem.  One other time, about a month ago, she had very short-lived numbness in the inside of her nose on the left side, lasted 30 seconds   Prior shingles was R side of face/scalp.  Still occasionally itches at the R eyebrow (related to prior shingles).  Cervical radiculopathy--she had injection in July or August for her neck, helped. No longer having any numbness in arm. Denies any neck pain currently  She had COVID in May.  She noticed hair loss a few weeks after.  She had spoken to Dr. Chalmers Cater back when it first started, told her to see derm. Called derm, told to take Biotin.  Has been taking Biotin.  It is maybe not as bad as earlier, but she still notices hair loss, and notices thinning especially on top. Denies other thyroid symptoms--no changes in energy, weight, bowels, skin, nails, moods.  Always cold-natured.  PMH, PSH, SH reviewed  Outpatient Encounter Medications as of 04/12/2021  Medication Sig Note   Biotin 5000 MCG CAPS Take 1 capsule by mouth daily.    brimonidine-timolol (COMBIGAN) 0.2-0.5 % ophthalmic solution Apply 1 drop to eye 2 (two) times daily.    cholecalciferol (VITAMIN D) 1000 units tablet Take 2,000 Units by mouth daily.  09/05/2020: Takes about 2-3 times a week   latanoprost (XALATAN) 0.005 % ophthalmic solution Place 1 drop into the left eye at bedtime.     levothyroxine (SYNTHROID) 112 MCG tablet Take 112 mcg by mouth daily before breakfast. 04/12/2021: BRAND   omeprazole (PRILOSEC) 20 MG capsule Take 20 mg by mouth 2 (two) times daily before a meal. 09/05/2020: Once daily   acetaminophen (TYLENOL) 325 MG tablet Take 325 mg by mouth every 6 (six) hours as needed. (Patient not taking: Reported on 04/12/2021)    [DISCONTINUED] benzonatate (TESSALON) 200 MG capsule Take 1 capsule (200 mg total) by mouth 3 (three) times daily as needed for cough.    Facility-Administered Encounter Medications as of 04/12/2021  Medication   denosumab (PROLIA) injection 60 mg   Allergies  Allergen Reactions   Codeine Anaphylaxis   Reclast [Zoledronic Acid] Diarrhea and Nausea And Vomiting    Joint pain   Prednisone     Nausea and vomiting   ROS: no fever, chills, URI symptoms, headaches, dizziness, chest pain, palpitations. Facial numbness, short-lived, per HPI, resolved. Denies any allergies, no f/c/n/v/d/cough/SOB/rashes/bleeding/bruising. No thyroid symptoms. See HPI   PHYSICAL EXAM:  BP 120/72   Pulse 80   Ht 5\' 6"  (1.676 m)   Wt 167 lb 6.4 oz (75.9 kg)   BMI 27.02 kg/m   Wt Readings from Last 3 Encounters:  04/12/21 167 lb 6.4 oz (75.9 kg)  10/13/20 165 lb (74.8 kg)  09/05/20 175 lb 6.4 oz (79.6 kg)   Well-appearing, pleasant female in no distress HEENT: conjunctiva and sclera are clear, EOMI, fundi benign. Skin  on face and scalp normal, no lesions Slight thinning of front of scalp, scalp otherwise normal, no flaking or rash. Nasal mucosa is normal, no lesions or erythema. OP is clear Neck: No lymphadenopathy, thyromegaly or carotid bruit Heart: regular rate and rhythm Lungs: clear bilaterally Abdomen: soft, nontender, no mass Extremities: no edema, normal pulses Neuro: alert and oriented.  Cranial nerves 2-12 intact. DTR's at [place] diminished, normal elsewhere. Normal strength, sensation, gait.   ASSESSMENT/PLAN:  Facial numbness -  short-lived, focal location, resolved.  Unclear etiology. Normal neuro exam, reassurred. Reviewed concerning s/sx for which she should seek re-eval  Hair loss - will recheck thyroid (and forward labs to Dr. Chalmers Cater). Cont Biotin.   - Plan: TSH   Hair loss--only slight improvement (was worse after COVID), with significant thrinning noted by pt. Recheck TSH today (and forward to Dr. Chalmers Cater if abnormal.  I spent 30 minutes dedicated to the care of this patient, including pre-visit review of records, face to face time, post-visit ordering of testing and documentation.

## 2021-04-12 NOTE — Patient Instructions (Addendum)
Your neurologic exam and physical exam was normal (other than slightly decreased reflexes in the right arm, which may be related to your neck). I don't see anything to explain the episode of numbness or to suggest that you are at risk for having a stroke.  Please let us know if you develop any other neurologic symptoms (change in vision, numbness, weakness, trouble thinking, speaking, problems with balance, etc).   We are checking the thyroid given your ongoing hair loss and thinning, and will forward the results to Dr. Chalmers Cater.

## 2021-04-13 LAB — TSH: TSH: 1.73 u[IU]/mL (ref 0.450–4.500)

## 2021-06-07 DIAGNOSIS — H401113 Primary open-angle glaucoma, right eye, severe stage: Secondary | ICD-10-CM | POA: Diagnosis not present

## 2021-06-12 DIAGNOSIS — L578 Other skin changes due to chronic exposure to nonionizing radiation: Secondary | ICD-10-CM | POA: Diagnosis not present

## 2021-06-12 DIAGNOSIS — Z86018 Personal history of other benign neoplasm: Secondary | ICD-10-CM | POA: Diagnosis not present

## 2021-06-12 DIAGNOSIS — Z23 Encounter for immunization: Secondary | ICD-10-CM | POA: Diagnosis not present

## 2021-06-12 DIAGNOSIS — L821 Other seborrheic keratosis: Secondary | ICD-10-CM | POA: Diagnosis not present

## 2021-06-12 DIAGNOSIS — L820 Inflamed seborrheic keratosis: Secondary | ICD-10-CM | POA: Diagnosis not present

## 2021-06-12 DIAGNOSIS — L814 Other melanin hyperpigmentation: Secondary | ICD-10-CM | POA: Diagnosis not present

## 2021-06-12 DIAGNOSIS — D225 Melanocytic nevi of trunk: Secondary | ICD-10-CM | POA: Diagnosis not present

## 2021-06-15 DIAGNOSIS — E890 Postprocedural hypothyroidism: Secondary | ICD-10-CM | POA: Diagnosis not present

## 2021-06-22 DIAGNOSIS — C73 Malignant neoplasm of thyroid gland: Secondary | ICD-10-CM | POA: Diagnosis not present

## 2021-06-22 DIAGNOSIS — E890 Postprocedural hypothyroidism: Secondary | ICD-10-CM | POA: Diagnosis not present

## 2021-07-17 DIAGNOSIS — Z1231 Encounter for screening mammogram for malignant neoplasm of breast: Secondary | ICD-10-CM | POA: Diagnosis not present

## 2021-07-17 LAB — HM MAMMOGRAPHY

## 2021-07-19 ENCOUNTER — Encounter: Payer: Self-pay | Admitting: *Deleted

## 2021-08-04 ENCOUNTER — Ambulatory Visit (INDEPENDENT_AMBULATORY_CARE_PROVIDER_SITE_OTHER): Payer: Medicare Other

## 2021-08-04 ENCOUNTER — Encounter: Payer: Self-pay | Admitting: Podiatry

## 2021-08-04 ENCOUNTER — Other Ambulatory Visit: Payer: Self-pay

## 2021-08-04 ENCOUNTER — Ambulatory Visit: Payer: Medicare Other | Admitting: Podiatry

## 2021-08-04 DIAGNOSIS — M7751 Other enthesopathy of right foot: Secondary | ICD-10-CM

## 2021-08-04 DIAGNOSIS — M79675 Pain in left toe(s): Secondary | ICD-10-CM

## 2021-08-04 DIAGNOSIS — M779 Enthesopathy, unspecified: Secondary | ICD-10-CM | POA: Diagnosis not present

## 2021-08-04 DIAGNOSIS — M7752 Other enthesopathy of left foot: Secondary | ICD-10-CM | POA: Diagnosis not present

## 2021-08-04 DIAGNOSIS — M205X9 Other deformities of toe(s) (acquired), unspecified foot: Secondary | ICD-10-CM | POA: Diagnosis not present

## 2021-08-04 DIAGNOSIS — M79674 Pain in right toe(s): Secondary | ICD-10-CM

## 2021-08-04 DIAGNOSIS — B351 Tinea unguium: Secondary | ICD-10-CM

## 2021-08-04 MED ORDER — TRIAMCINOLONE ACETONIDE 10 MG/ML IJ SUSP
20.0000 mg | Freq: Once | INTRAMUSCULAR | Status: AC
  Administered 2021-08-04: 20 mg

## 2021-08-06 NOTE — Progress Notes (Signed)
Subjective:  ? ?Patient ID: Barbara Parker, female   DOB: 76 y.o.   MRN: [id number]  ? ?HPI ?Patient presents stating that she has developed intense discomfort in the big toe joints of both feet.  States she did 100 mile hike over approximate 10 days and the symptoms occurred after that and have not let up.  Patient does not smoke likes to be active ? ? ?Review of Systems  ?All other systems reviewed and are negative. ? ? ?   ?Objective:  ?Physical Exam ?Vitals and nursing note reviewed.  ?Constitutional:   ?   Appearance: She is well-developed.  ?Pulmonary:  ?   Effort: Pulmonary effort is normal.  ?Musculoskeletal:     ?   General: Normal range of motion.  ?Skin: ?   General: Skin is warm.  ?Neurological:  ?   Mental Status: She is alert.  ?  ?Neurovascular status intact muscle strength adequate range of motion adequate with exquisite discomfort first MPJ left over right with inflammation fluid around the joint moderate swelling occurring around the joint surface with mild limitation of motion but no crepitus.  Patient is found to have good digital perfusion well oriented and does have some discoloration of her nailbeds ? ?   ?Assessment:  ?Functional hallux limitus deformity left over right with inflammation which developed secondary to increased activity along with mycotic nail infection both feet which is more related to the trauma to the bed secondary to the structure and position ? ?   ?Plan:  ?H&P reviewed all conditions with education given concerning both.  I did discuss possible surgical intervention in future based on x-rays but today I went ahead did sterile prep and injected periarticular around the first MPJ 3 mg dexamethasone Kenalog 5 mg Xylocaine and discussed again the possibility for other treatments but at this point I want her to use rigid bottom shoes and we will see results.  Do not recommend current treatment for nail disease as I think most of it is more related to trauma ? ?X-rays indicate  spur formation of the first metatarsal head and into the joint left over right but no indications of advanced arthritis of the joint surface ?   ? ? ?

## 2021-08-10 ENCOUNTER — Telehealth: Payer: Self-pay | Admitting: Family Medicine

## 2021-08-10 ENCOUNTER — Encounter: Payer: Self-pay | Admitting: Family Medicine

## 2021-08-10 NOTE — Telephone Encounter (Signed)
Received a call from pt concerning her next Prolia injection. Pt was advise that estimated cost of medication is $302. Appt was made for 08/17/2021. Medication ordered from pharmacy.  ?

## 2021-08-14 ENCOUNTER — Other Ambulatory Visit: Payer: Self-pay | Admitting: Podiatry

## 2021-08-14 DIAGNOSIS — M779 Enthesopathy, unspecified: Secondary | ICD-10-CM

## 2021-08-17 ENCOUNTER — Other Ambulatory Visit: Payer: Medicare Other

## 2021-08-17 DIAGNOSIS — M810 Age-related osteoporosis without current pathological fracture: Secondary | ICD-10-CM | POA: Diagnosis not present

## 2021-08-17 MED ORDER — DENOSUMAB 60 MG/ML ~~LOC~~ SOSY
60.0000 mg | PREFILLED_SYRINGE | Freq: Once | SUBCUTANEOUS | Status: AC
  Administered 2021-08-17: 60 mg via SUBCUTANEOUS

## 2021-09-13 DIAGNOSIS — H401113 Primary open-angle glaucoma, right eye, severe stage: Secondary | ICD-10-CM | POA: Diagnosis not present

## 2021-09-15 NOTE — Progress Notes (Signed)
?Chief Complaint  ?Patient presents with  ? Medicare Wellness  ?  Fasting AWV/CPE with pelvic. No new concerns. Has not yet has Tdap, will get.   ? ? ?Barbara Parker is a 76 y.o. female who presents for annual physical exam, Medicare wellness visit and follow-up on chronic medical conditions. ? ?Osteoporosis:  She has been getting Prolia injections and tolerating without side effects. Last injection was in 08/2021. ?She had DEXA 05/2015 which showed osteoporosis in the spine, T-3.4.  She had Reclast infusion on 07/05/15, followed by vomiting and diarrhea and severe aches in the joints (pain lasted up to 2 weeks).  Last DEXA was 10/2020, T-2.7 spine, -2.4 L fem neck, -2.1 R fem neck (stable spine, improved at hips, per report). ?She has yogurt, cheese, milk daily (2 servings/day).  Gets weight-bearing exercise regularly (weights at home). ?  ?Vitamin D deficiency: Last level was 29.3 in 09/2020, and also borderline low at 30.2 in 08/2019.  She had reported only taking D3 2000 IU about 3x/week, having trouble getting into a routine.   ?She currently reports still taking it just 2-3x/week ,though does double up on it once a week. ? ?Hypothyroidism:  She is under the care of Dr. Chalmers Cater. No notes/labs received. TSH last checked here in 04/2021 (when she had some complaints of facial numbness), and was at goal. She sees her every January, no changes were made. ?Denies changes to hair/nails/skin/bowels/energy/moods.  ? ?Lab Results  ?Component Value Date  ? TSH 1.730 04/12/2021  ? ?  ?GERD:  She previously used to take Dexilant qod (due to cost, but if she has a flare would use it daily for a few days).  Last year she switched to omeprazole '20mg'$  daily, and in the last 4 months she has been taking it every other day.  She is doing very well on this regimen, occasionally needing an extra dose.  Denies dysphagia. ?  ?Decreased hearing in the right > left ears.  She has bilateral hearing aids.  Denies changes to her hearing.  She  isn't wearing them today, and isn't having problems. ?  ?Urinary urge incontinence--Isn't as bothersome as in the past, as long as she remembers to void more frequently. Denies leakage with cough/sneeze. ?Denies dysuria, hematuria. Up 0-1x/night to void. ?  ?Glaucoma, under the care of Dr. [place], saw her this week ,pressures are well controlled. ? ? ?Immunization History  ?Administered Date(s) Administered  ? Fluad Quad(high Dose 65+) 02/09/2021  ? Influenza Split 04/09/2011, 03/25/2012, 03/05/2019  ? Influenza, High Dose Seasonal PF 03/16/2013, 03/29/2015, 02/22/2016, 03/05/2017, 03/21/2018, 03/09/2020  ? PFIZER(Purple Top)SARS-COV-2 Vaccination 07/09/2019, 07/30/2019, 03/09/2020  ? Pension scheme manager 85yr & up 02/15/2021  ? Pneumococcal Conjugate-13 03/16/2013  ? Pneumococcal Polysaccharide-23 04/20/2015  ? Tdap 04/09/2011  ? Zoster Recombinat (Shingrix) 03/05/2019, 05/12/2019  ? Zoster, Live 05/15/2011  ? ?Last Pap smear: 2004; s/p hysterectomy (for fibroids) ?Last mammogram: 07/2021 ?Last colonoscopy: 04/2012  Dr. MCollene Mares  ?Last DEXA: 10/2020 T-2.7 at spine, -2.4 L fem neck, -2.1 R fem neck ?Dentist: twice yearly   ?Ophtho: twice yearly ?Exercise:  Walking (30-35 mins 3-5x/week). ?Dance exercise classes 1-2x/week.  Does weights at home daily. ?At gym--dance classes, rowing machine. ? ?Lipids: ?Lab Results  ?Component Value Date  ? CHOL 220 (H) 08/26/2017  ? HDL 85 08/26/2017  ? LDLCALC 121 (H) 08/26/2017  ? TRIG 68 08/26/2017  ? CHOLHDL 2.6 08/26/2017  ? ?Patient Care Team: ?KRita Ohara MD as PCP -  General (Family Medicine) ?Ophtho: Dr. [place] ?Dentist: Dr. Altamese [place] ?GI:  Dr. Collene Mares ?Endocrinologist: Dr. Chalmers Cater ?Ortho: Dr. Maxie Better (stress fracture R foot after fall 11/2014), Dr. Rhona Raider (knee) ?ENT: Dr. Constance Holster (for broken nose from fall 11/2014, hearing loss) ?Derm: Dr. Renda Rolls  ?Podiatrist: Dr. Paulla Dolly ? ?Depression Screening: ?[place] Office Visit from 09/18/2021 in [place]  ?PHQ-2  Total Score 0  ? ?  ?  ? ? ?Falls screen:  ? ?  09/18/2021  ?  8:31 AM 04/12/2021  ?  1:31 PM 09/05/2020  ?  8:36 AM 07/25/2020  ?  2:24 PM 09/02/2019  ?  8:40 AM  ?Fall Risk   ?Falls in the past year? 0 0 1 0 1  ?Number falls in past yr: 0 0 0  1  ?Comment   tripped and fell last week  4  ?Injury with Fall? 0 0 0  1  ?Comment     seeing Dr. Rhona Raider  ?Risk for fall due to : No Fall Risks No Fall Risks Other (Comment)    ?Risk for fall due to: Comment   patient tripped    ?Follow up Falls evaluation completed Falls evaluation completed Falls evaluation completed    ?  ? ?Functional Status Survey: ?Is the patient deaf or have difficulty hearing?: Yes (only according to her children) ?Does the patient have difficulty seeing, even when wearing glasses/contacts?: Yes (uses reading glasses) ?Does the patient have difficulty concentrating, remembering, or making decisions?: No ?Does the patient have difficulty walking or climbing stairs?: No ?Does the patient have difficulty dressing or bathing?: No ?Does the patient have difficulty doing errands alone such as visiting a doctor's office or shopping?: No ? ?Mini-Cog Scoring: 5  ? ?  ?End of Life Discussion:  Patient has a living will and medical power of attorney; copies have not been received.  She filled them out, gave to her daughter, but doesn't have a copy herself. Requested new forms. ? ?Outpatient Encounter Medications as of 09/18/2021  ?Medication Sig Note  ? Biotin 5000 MCG CAPS Take 1 capsule by mouth daily. 09/18/2021: 2-3 times a week  ? brimonidine-timolol (COMBIGAN) 0.2-0.5 % ophthalmic solution Apply 1 drop to eye 2 (two) times daily.   ? cholecalciferol (VITAMIN D) 1000 units tablet Take 2,000 Units by mouth daily.  09/18/2021: Takes it 2-3x/week, doubles up about once a week  ? latanoprost (XALATAN) 0.005 % ophthalmic solution Place 1 drop into the left eye at bedtime.   ? levothyroxine (SYNTHROID) 112 MCG tablet Take 112 mcg by mouth daily before breakfast.  04/12/2021: BRAND  ? omeprazole (PRILOSEC) 20 MG capsule Take 20 mg by mouth 2 (two) times daily before a meal. 09/18/2021: Every other day  ? acetaminophen (TYLENOL) 325 MG tablet Take 325 mg by mouth every 6 (six) hours as needed. (Patient not taking: Reported on 04/12/2021) 09/18/2021: prn  ? ?Facility-Administered Encounter Medications as of 09/18/2021  ?Medication  ? denosumab (PROLIA) injection 60 mg  ? ?Allergies  ?Allergen Reactions  ? Codeine Anaphylaxis  ? Reclast [Zoledronic Acid] Diarrhea and Nausea And Vomiting  ?  Joint pain  ? Prednisone   ?  Nausea and vomiting  ? ? ? ?PMH, PSH, SH and FH were reviewed and updated ? ?Outpatient Encounter Medications as of 09/18/2021  ?Medication Sig Note  ? Biotin 5000 MCG CAPS Take 1 capsule by mouth daily. 09/18/2021: 2-3 times a week  ? brimonidine-timolol (COMBIGAN) 0.2-0.5 % ophthalmic solution Apply 1 drop to eye 2 (  two) times daily.   ? cholecalciferol (VITAMIN D) 1000 units tablet Take 2,000 Units by mouth daily.  09/18/2021: Takes it 2-3x/week, doubles up about once a week  ? latanoprost (XALATAN) 0.005 % ophthalmic solution Place 1 drop into the left eye at bedtime.   ? levothyroxine (SYNTHROID) 112 MCG tablet Take 112 mcg by mouth daily before breakfast. 04/12/2021: BRAND  ? omeprazole (PRILOSEC) 20 MG capsule Take 20 mg by mouth 2 (two) times daily before a meal. 09/18/2021: Every other day  ? acetaminophen (TYLENOL) 325 MG tablet Take 325 mg by mouth every 6 (six) hours as needed. (Patient not taking: Reported on 04/12/2021) 09/18/2021: prn  ? ?Facility-Administered Encounter Medications as of 09/18/2021  ?Medication  ? denosumab (PROLIA) injection 60 mg  ? ?Allergies  ?Allergen Reactions  ? Codeine Anaphylaxis  ? Reclast [Zoledronic Acid] Diarrhea and Nausea And Vomiting  ?  Joint pain  ? Prednisone   ?  Nausea and vomiting  ? ? ?ROS: The patient denies anorexia, fever, vision changes (some decrease in peripheral vision on right); denies ear pain, sore throat,  breast concerns, chest pain, palpitations, dizziness, syncope, dyspnea on exertion, cough, swelling, nausea, vomiting, constipation, abdominal pain, melena, hematochezia, indigestion/heartburn, urinary incontinen

## 2021-09-15 NOTE — Patient Instructions (Addendum)
?  HEALTH MAINTENANCE RECOMMENDATIONS: ? ?It is recommended that you get at least 30 minutes of aerobic exercise at least 5 days/week (for weight loss, you may need as much as 60-90 minutes). This can be any activity that gets your heart rate up. This can be divided in 10-15 minute intervals if needed, but try and build up your endurance at least once a week.  Weight bearing exercise is also recommended twice weekly. ? ?Eat a healthy diet with lots of vegetables, fruits and fiber.  "Colorful" foods have a lot of vitamins (ie green vegetables, tomatoes, red peppers, etc).  Limit sweet tea, regular sodas and alcoholic beverages, all of which has a lot of calories and sugar.  Up to 1 alcoholic drink daily may be beneficial for women (unless trying to lose weight, watch sugars).  Drink a lot of water. ? ?Calcium recommendations are 1200-1500 mg daily (1500 mg for postmenopausal women or women without ovaries), and vitamin D 1000 IU daily.  This should be obtained from diet and/or supplements (vitamins), and calcium should not be taken all at once, but in divided doses. ? ?Monthly self breast exams and yearly mammograms for women over the age of 106 is recommended. ? ?Sunscreen of at least SPF 30 should be used on all sun-exposed parts of the skin when outside between the hours of 10 am and 4 pm (not just when at beach or pool, but even with exercise, golf, tennis, and yard work!)  Use a sunscreen that says "broad spectrum" so it covers both UVA and UVB rays, and make sure to reapply every 1-2 hours. ? ?Remember to change the batteries in your smoke detectors when changing your clock times in the spring and fall. Carbon monoxide detectors are recommended for your home. ? ?Use your seat belt every time you are in a car, and please drive safely and not be distracted with cell phones and texting while driving. ? ? ?Barbara Parker , ?Thank you for taking time to come for your Medicare Wellness Visit. I appreciate your ongoing  commitment to your health goals. Please review the following plan we discussed and let me know if I can assist you in the future.  ? ?This is a list of the screening recommended for you and due dates:  ?Health Maintenance  ?Topic Date Due  ? Tetanus Vaccine  04/08/2021  ? Flu Shot  01/02/2022  ? Colon Cancer Screening  04/11/2022  ? Mammogram  07/17/2022  ? Pneumonia Vaccine  Completed  ? DEXA scan (bone density measurement)  Completed  ? COVID-19 Vaccine  Completed  ? Hepatitis C Screening: USPSTF Recommendation to screen - Ages 44-79 yo.  Completed  ? Zoster (Shingles) Vaccine  Completed  ? HPV Vaccine  Aged Out  ? ?Please get tetanus booster (TdaP) from the pharmacy.  This is recommended every 10 years. ? ?Please bring [place] copies of your Living Will and [place] once completed and notarized so that it can be scanned into your medical chart. ? ?Bone density tests are recommended every 2 years, next due 10/2022. ? ?Colon cancer screening will be due in November.  Options we discussed include colonoscopy (check with Dr. Collene Mares) or Cologuard.  ?

## 2021-09-18 ENCOUNTER — Encounter: Payer: Self-pay | Admitting: Family Medicine

## 2021-09-18 ENCOUNTER — Ambulatory Visit (INDEPENDENT_AMBULATORY_CARE_PROVIDER_SITE_OTHER): Payer: Medicare Other | Admitting: Family Medicine

## 2021-09-18 VITALS — BP 110/70 | HR 60 | Ht 66.0 in | Wt 167.0 lb

## 2021-09-18 DIAGNOSIS — Z5181 Encounter for therapeutic drug level monitoring: Secondary | ICD-10-CM

## 2021-09-18 DIAGNOSIS — M810 Age-related osteoporosis without current pathological fracture: Secondary | ICD-10-CM | POA: Diagnosis not present

## 2021-09-18 DIAGNOSIS — Z0000 Encounter for general adult medical examination without abnormal findings: Secondary | ICD-10-CM

## 2021-09-18 DIAGNOSIS — E559 Vitamin D deficiency, unspecified: Secondary | ICD-10-CM

## 2021-09-18 DIAGNOSIS — Z13220 Encounter for screening for lipoid disorders: Secondary | ICD-10-CM

## 2021-09-18 DIAGNOSIS — D692 Other nonthrombocytopenic purpura: Secondary | ICD-10-CM | POA: Diagnosis not present

## 2021-09-19 LAB — COMPREHENSIVE METABOLIC PANEL
ALT: 13 IU/L (ref 0–32)
AST: 16 IU/L (ref 0–40)
Albumin/Globulin Ratio: 1.7 (ref 1.2–2.2)
Albumin: 4 g/dL (ref 3.7–4.7)
Alkaline Phosphatase: 136 IU/L — ABNORMAL HIGH (ref 44–121)
BUN/Creatinine Ratio: 16 (ref 12–28)
BUN: 12 mg/dL (ref 8–27)
Bilirubin Total: 0.4 mg/dL (ref 0.0–1.2)
CO2: 25 mmol/L (ref 20–29)
Calcium: 9.2 mg/dL (ref 8.7–10.3)
Chloride: 103 mmol/L (ref 96–106)
Creatinine, Ser: 0.75 mg/dL (ref 0.57–1.00)
Globulin, Total: 2.3 g/dL (ref 1.5–4.5)
Glucose: 66 mg/dL — ABNORMAL LOW (ref 70–99)
Potassium: 4.7 mmol/L (ref 3.5–5.2)
Sodium: 142 mmol/L (ref 134–144)
Total Protein: 6.3 g/dL (ref 6.0–8.5)
eGFR: 84 mL/min/{1.73_m2} (ref >59)

## 2021-09-19 LAB — CBC WITH DIFFERENTIAL/PLATELET
Basophils Absolute: 0.1 10*3/uL (ref 0.0–0.2)
Basos: 1 %
EOS (ABSOLUTE): 0.1 10*3/uL (ref 0.0–0.4)
Eos: 1 %
Hematocrit: 39.1 % (ref 34.0–46.6)
Hemoglobin: 13.3 g/dL (ref 11.1–15.9)
Immature Grans (Abs): 0 10*3/uL (ref 0.0–0.1)
Immature Granulocytes: 1 %
Lymphocytes Absolute: 1.3 10*3/uL (ref 0.7–3.1)
Lymphs: 17 %
MCH: 29.4 pg (ref 26.6–33.0)
MCHC: 34 g/dL (ref 31.5–35.7)
MCV: 87 fL (ref 79–97)
Monocytes Absolute: 0.8 10*3/uL (ref 0.1–0.9)
Monocytes: 10 %
Neutrophils Absolute: 5.5 10*3/uL (ref 1.4–7.0)
Neutrophils: 70 %
Platelets: 371 10*3/uL (ref 150–450)
RBC: 4.52 x10E6/uL (ref 3.77–5.28)
RDW: 13.5 % (ref 11.7–15.4)
WBC: 7.7 10*3/uL (ref 3.4–10.8)

## 2021-09-19 LAB — LIPID PANEL
Chol/HDL Ratio: 2.4 ratio (ref 0.0–4.4)
Cholesterol, Total: 213 mg/dL — ABNORMAL HIGH (ref 100–199)
HDL: 90 mg/dL (ref >39)
LDL Chol Calc (NIH): 111 mg/dL — ABNORMAL HIGH (ref 0–99)
Triglycerides: 68 mg/dL (ref 0–149)
VLDL Cholesterol Cal: 12 mg/dL (ref 5–40)

## 2021-09-19 LAB — VITAMIN D 25 HYDROXY (VIT D DEFICIENCY, FRACTURES): Vit D, 25-Hydroxy: 24.2 ng/mL — ABNORMAL LOW (ref 30.0–100.0)

## 2021-09-19 MED ORDER — VITAMIN D (ERGOCALCIFEROL) 1.25 MG (50000 UNIT) PO CAPS: 50000.0000 [IU] | ORAL_CAPSULE | ORAL | 0 refills | Status: DC

## 2021-09-19 NOTE — Addendum Note (Signed)
Addended byRita Ohara on: 09/19/2021 11:21 AM ? ? Modules accepted: Orders ? ?

## 2021-11-14 ENCOUNTER — Other Ambulatory Visit: Payer: Self-pay | Admitting: Family Medicine

## 2021-11-14 DIAGNOSIS — E559 Vitamin D deficiency, unspecified: Secondary | ICD-10-CM

## 2021-11-28 DIAGNOSIS — Z9889 Other specified postprocedural states: Secondary | ICD-10-CM | POA: Diagnosis not present

## 2021-11-28 DIAGNOSIS — M1711 Unilateral primary osteoarthritis, right knee: Secondary | ICD-10-CM | POA: Diagnosis not present

## 2021-11-28 DIAGNOSIS — M25561 Pain in right knee: Secondary | ICD-10-CM | POA: Diagnosis not present

## 2021-11-28 DIAGNOSIS — K219 Gastro-esophageal reflux disease without esophagitis: Secondary | ICD-10-CM | POA: Diagnosis not present

## 2021-11-28 DIAGNOSIS — R600 Localized edema: Secondary | ICD-10-CM | POA: Diagnosis not present

## 2021-12-04 ENCOUNTER — Telehealth: Payer: Self-pay

## 2021-12-04 NOTE — Telephone Encounter (Signed)
Pt. Called back to let me know why she was in hospital. She stated she drove about 2 hours to [place] with some friends and for some reason when she got out of the car her rt. Knee started hurting. Her friends bothered her so much about the made her go to the ER near there because they were worried she had a blood clot or something. She stated they did some blood work and she did not have a blood clot. She stats now it is feeling much better but just a little sore. She is taking Ibuprofen and has elevated the knee as well. She will call back if f/u is needed but at this time she is ok.

## 2021-12-04 NOTE — Telephone Encounter (Signed)
I called pt. And LM per pt. Ping report she went to [facility] but there was no notes in the system. I had to LM for pt. To call back.

## 2021-12-04 NOTE — Telephone Encounter (Signed)
Was in response to a report you got? I don't have any hospital records, not aware of what happened. Glad she feels fine now.

## 2021-12-19 DIAGNOSIS — H401133 Primary open-angle glaucoma, bilateral, severe stage: Secondary | ICD-10-CM | POA: Diagnosis not present

## 2022-01-26 ENCOUNTER — Other Ambulatory Visit: Payer: Self-pay | Admitting: Family Medicine

## 2022-01-26 DIAGNOSIS — E559 Vitamin D deficiency, unspecified: Secondary | ICD-10-CM

## 2022-02-07 ENCOUNTER — Encounter: Payer: Self-pay | Admitting: Internal Medicine

## 2022-02-22 DIAGNOSIS — E039 Hypothyroidism, unspecified: Secondary | ICD-10-CM | POA: Diagnosis not present

## 2022-02-22 DIAGNOSIS — K219 Gastro-esophageal reflux disease without esophagitis: Secondary | ICD-10-CM | POA: Diagnosis not present

## 2022-02-22 DIAGNOSIS — Z1211 Encounter for screening for malignant neoplasm of colon: Secondary | ICD-10-CM | POA: Diagnosis not present

## 2022-02-23 ENCOUNTER — Telehealth: Payer: Self-pay | Admitting: Family Medicine

## 2022-02-23 NOTE — Telephone Encounter (Signed)
Pt called and advised time for next Prolia. Pt advised estimated out of pocket cost is $317.  Pt schedule an appointment for 03/01/2022 and advised to call the office as she is leaving to set medication out. Medication ordered from [facility].

## 2022-03-01 ENCOUNTER — Other Ambulatory Visit: Payer: Medicare Other

## 2022-03-01 DIAGNOSIS — M810 Age-related osteoporosis without current pathological fracture: Secondary | ICD-10-CM | POA: Diagnosis not present

## 2022-03-01 MED ORDER — DENOSUMAB 60 MG/ML ~~LOC~~ SOSY
60.0000 mg | PREFILLED_SYRINGE | Freq: Once | SUBCUTANEOUS | Status: AC
  Administered 2022-03-01: 60 mg via SUBCUTANEOUS

## 2022-03-05 DIAGNOSIS — H401131 Primary open-angle glaucoma, bilateral, mild stage: Secondary | ICD-10-CM | POA: Diagnosis not present

## 2022-03-13 ENCOUNTER — Encounter: Payer: Self-pay | Admitting: Internal Medicine

## 2022-03-30 DIAGNOSIS — Z1212 Encounter for screening for malignant neoplasm of rectum: Secondary | ICD-10-CM | POA: Diagnosis not present

## 2022-03-30 DIAGNOSIS — Z1211 Encounter for screening for malignant neoplasm of colon: Secondary | ICD-10-CM | POA: Diagnosis not present

## 2022-04-09 ENCOUNTER — Other Ambulatory Visit: Payer: Self-pay | Admitting: Family Medicine

## 2022-04-09 DIAGNOSIS — E559 Vitamin D deficiency, unspecified: Secondary | ICD-10-CM

## 2022-04-09 LAB — COLOGUARD: Cologuard: NEGATIVE

## 2022-04-11 ENCOUNTER — Encounter: Payer: Self-pay | Admitting: *Deleted

## 2022-04-12 ENCOUNTER — Encounter: Payer: Self-pay | Admitting: Family Medicine

## 2022-06-12 DIAGNOSIS — H401131 Primary open-angle glaucoma, bilateral, mild stage: Secondary | ICD-10-CM | POA: Diagnosis not present

## 2022-06-15 DIAGNOSIS — E890 Postprocedural hypothyroidism: Secondary | ICD-10-CM | POA: Diagnosis not present

## 2022-06-21 DIAGNOSIS — R682 Dry mouth, unspecified: Secondary | ICD-10-CM | POA: Diagnosis not present

## 2022-06-21 DIAGNOSIS — E890 Postprocedural hypothyroidism: Secondary | ICD-10-CM | POA: Diagnosis not present

## 2022-06-21 DIAGNOSIS — R631 Polydipsia: Secondary | ICD-10-CM | POA: Diagnosis not present

## 2022-06-21 DIAGNOSIS — C73 Malignant neoplasm of thyroid gland: Secondary | ICD-10-CM | POA: Diagnosis not present

## 2022-07-23 DIAGNOSIS — Z1231 Encounter for screening mammogram for malignant neoplasm of breast: Secondary | ICD-10-CM | POA: Diagnosis not present

## 2022-07-23 LAB — HM MAMMOGRAPHY

## 2022-07-25 ENCOUNTER — Encounter: Payer: Self-pay | Admitting: *Deleted

## 2022-08-20 DIAGNOSIS — E890 Postprocedural hypothyroidism: Secondary | ICD-10-CM | POA: Diagnosis not present

## 2022-09-13 DIAGNOSIS — H401131 Primary open-angle glaucoma, bilateral, mild stage: Secondary | ICD-10-CM | POA: Diagnosis not present

## 2022-09-19 ENCOUNTER — Telehealth: Payer: Self-pay | Admitting: Family Medicine

## 2022-09-19 NOTE — Telephone Encounter (Signed)
Pt called and wanted to Add Prolia to her 04/22/204 AWV. Pt advised Estimated cost would be $317.  Medication ordered form [facility]. Prior auth completed and noted on appt,

## 2022-09-20 NOTE — Progress Notes (Signed)
Chief Complaint  Patient presents with   Medicare Wellness    Fasting AWV/CPE with pelvic. Due for prolia, will give later with Prevnar 20. Did not get Tdap, covid booster or RSV. She will get a Tdap in 2 weeks and RSV in the fall. Dr.Balan checked her TSH in Jan and again a few weeks later. Kept her on synthroid. Has been having a lot of heartburn lately, was taking Prilosec OTC every other day, now taking daily and not working. Took Dexilant in the past and worked better than Prilosec.  Mammogram showed B/L calcifications.    Barbara Parker is a 77 y.o. female who presents for annual physical exam, Medicare wellness visit and follow-up on chronic medical conditions.  Her legs "haven't felt the same" since her last COVID booster.  They feel heavy, swollen, different, feel tight, a constant feeling. This is why she declines COVID boosters. Denies any significant change.  Osteoporosis:  She has been getting Prolia injections and tolerating without side effects. Last injection was in 02/2022, due now. She had DEXA 05/2015 which showed osteoporosis in the spine, T-3.4.  She had Reclast infusion on 07/05/15, followed by vomiting and diarrhea and severe aches in the joints (pain lasted up to 2 weeks).   Last DEXA was 10/2020, T-2.7 spine, -2.4 L fem neck, -2.1 R fem neck (stable spine, improved at hips, per report). She has yogurt, cheese, milk daily (2 servings/day).  Gets weight-bearing exercise regularly (weights at home).   Vitamin D deficiency: Last level was 24.2 in 09/2021, when taking vitamin D3 2000 IU only 2-3x/week (but doubled up on it once a week). She was given prescription vitamin D for 8 weeks, and was advised to take 2000 IU of D3 daily (suggested taking when she does her eye drops at night), vs changing to 5000 IU (or doubling up on the 2000 IU she had), and being sure to take it at least 3x/week. She is currently taking 5000 IU every other day.  Hypothyroidism:  She is under the  care of Dr. Talmage Nap. She was last seen in 06/2022.  TSH was low at 0.378 on 112 mcg dose of Synthroid. Dose was decreased to 100 mcg. Patient states she had labs rechecked and kept on 100 mcg dose.  Denies changes to hair/nails/skin/bowels/energy/moods.   GERD:  Last year she had cut back to taking omeprazole 20 mg every other day, only occasionally taking an extra dose, when needed. (She had cut back from daily use of omeprazole 20mg ; she previously used to take Dexilant qod (due to cost, but took it daily for a few days with flares). She is back to taking omeprazole daily, but still having a lot of heartburn. She denies any changes to her diet.  She has gained 10# since her visit last year. She denies dysphagia or early satiety. Had UGI in 06/2009 showing hiatal hernia and GE reflux.  Decreased hearing in the right > left ears.  She has bilateral hearing aids.  Denies changes to her hearing.  She isn't wearing them today, and isn't having problems.   Urinary urge incontinence--Isn't as bothersome as in the past, as long as she remembers to void more frequently. Denies leakage with cough/sneeze. Denies dysuria, hematuria. Up 0-1x/night to void.   Glaucoma, under the care of Dr. [place]i.   Immunization History  Administered Date(s) Administered   Fluad Quad(high Dose 65+) 02/09/2021   Influenza Split 04/09/2011, 03/25/2012, 03/05/2019   Influenza, High Dose Seasonal PF 03/16/2013,  03/29/2015, 02/22/2016, 03/05/2017, 03/21/2018, 03/09/2020   Influenza-Unspecified 03/22/2022   PFIZER(Purple Top)SARS-COV-2 Vaccination 07/09/2019, 07/30/2019, 03/09/2020   Pfizer Covid-19 Vaccine Bivalent Booster 32yrs & up 02/15/2021   Pneumococcal Conjugate-13 03/16/2013   Pneumococcal Polysaccharide-23 04/20/2015   Tdap 04/09/2011   Zoster Recombinat (Shingrix) 03/05/2019, 05/12/2019   Zoster, Live 05/15/2011   She thinks she got RSV vaccine when she got the flu shot from the pharmacy, will check with  pharmacist. Last Pap smear: 2004; s/p hysterectomy (for fibroids) Last mammogram: 07/2022 normal, at [place]. Breast calcifications noted. Last colonoscopy: 04/2012  Dr. Loreta Ave; negative Cologuard 04/2022 Last DEXA: 10/2020 T-2.7 at spine, -2.4 L fem neck, -2.1 R fem neck Dentist: twice yearly   Ophtho: twice yearly Exercise:  Walking (30-35 mins 3-5x/week).  Does weights at home daily (5# each).  Does rowing machine once a week at the gym.  Lipids: Lab Results  Component Value Date   CHOL 213 (H) 09/18/2021   HDL 90 09/18/2021   LDLCALC 111 (H) 09/18/2021   TRIG 68 09/18/2021   CHOLHDL 2.4 09/18/2021   Patient Care Team: Joselyn Arrow, MD as PCP - General (Family Medicine) [place], Heather, OD (Optometry) Charna Elizabeth, MD as Consulting Physician (Gastroenterology) Dorisann Frames, MD as Referring Physician (Endocrinology) Jene Every, MD as Consulting Physician (Orthopedic Surgery) Marcene Corning, MD as Consulting Physician (Orthopedic Surgery) Serena Colonel, MD as Consulting Physician (Otolaryngology) Haverstock, Elvin So, MD as Referring Physician (Dermatology) Lenn Sink, DPM as Consulting Physician (Podiatry) Dentist: Dr. Lennice Sites  Depression Screening: Flowsheet Row Office Visit from 09/24/2022 in [facility]  PHQ-2 Total Score 0        Falls screen:     09/24/2022    8:30 AM 09/18/2021    8:31 AM 04/12/2021    1:31 PM 09/05/2020    8:36 AM 07/25/2020    2:24 PM  Fall Risk   Falls in the past year? 0 0 0 1 0  Number falls in past yr: 0 0 0 0   Comment    tripped and fell last week   Injury with Fall? 0 0 0 0   Risk for fall due to : No Fall Risks No Fall Risks No Fall Risks Other (Comment)   Risk for fall due to: Comment    patient tripped   Follow up Falls evaluation completed Falls evaluation completed Falls evaluation completed Falls evaluation completed      Functional Status Survey: Is the patient deaf or have difficulty hearing?: Yes Does the  patient have difficulty seeing, even when wearing glasses/contacts?: Yes (shingles in right eye in the past) Does the patient have difficulty concentrating, remembering, or making decisions?: No Does the patient have difficulty walking or climbing stairs?: Yes (feels like her legs stay inflammed since last covid booster) Does the patient have difficulty dressing or bathing?: No Does the patient have difficulty doing errands alone such as visiting a doctor's office or shopping?: No  Mini-Cog Scoring: 5     End of Life Discussion:  Patient has a living will and medical power of attorney; copies have not been received.  She filled them out, gave to her daughter, but doesn't have a copy herself. Requested new forms last year. Still has them at home.    PMH, PSH, SH and FH were reviewed and updated  Outpatient Encounter Medications as of 09/24/2022  Medication Sig Note   Biotin 5000 MCG CAPS Take 1 capsule by mouth daily. 09/24/2022: 2-3 times a week   brimonidine-timolol (  COMBIGAN) 0.2-0.5 % ophthalmic solution Apply 1 drop to eye 2 (two) times daily.    cholecalciferol (VITAMIN D) 1000 units tablet Take 2,000 Units by mouth daily.  09/24/2022: 2-3 times a week   latanoprost (XALATAN) 0.005 % ophthalmic solution Place 1 drop into both eyes at bedtime.    levothyroxine (SYNTHROID) 100 MCG tablet Take 100 mcg by mouth daily before breakfast. 09/24/2022: brand   omeprazole (PRILOSEC) 20 MG capsule Take 20 mg by mouth daily.    acetaminophen (TYLENOL) 325 MG tablet Take 325 mg by mouth every 6 (six) hours as needed. (Patient not taking: Reported on 04/12/2021) 09/24/2022: As needed   [DISCONTINUED] levothyroxine (SYNTHROID) 112 MCG tablet Take 112 mcg by mouth daily before breakfast. 04/12/2021: BRAND   [DISCONTINUED] Vitamin D, Ergocalciferol, (DRISDOL) 1.25 MG (50000 UNIT) CAPS capsule TAKE 1 CAPSULE BY MOUTH EVERY 7 DAYS.    Facility-Administered Encounter Medications as of 09/24/2022  Medication    denosumab (PROLIA) injection 60 mg   denosumab (PROLIA) injection 60 mg   Allergies  Allergen Reactions   Codeine Anaphylaxis   Reclast [Zoledronic Acid] Diarrhea and Nausea And Vomiting    Joint pain   Prednisone     Nausea and vomiting    ROS: The patient denies anorexia, fever, vision changes (some decrease in peripheral vision on right); denies ear pain, sore throat, breast concerns, chest pain, palpitations, dizziness, syncope, dyspnea on exertion, cough, swelling, nausea, vomiting, constipation, abdominal pain, melena, hematochezia, indigestion/heartburn, urinary incontinence, hematuria, dysuria, vaginal bleeding, discharge, odor or itch, genital lesions, numbness, tingling, weakness, tremor, suspicious skin lesions, depression, anxiety, abnormal bleeding, or enlarged lymph nodes.   Mouth dryness (pt states Dr. Talmage Nap thinks may have been related to the radiation). Chronically bruises easily, unchanged. Decreased hearing, has bilateral hearing aids. Tightness/discomfort in BLE (ever since last COVID booster); denies visible swelling. Reflux is flaring, per HPI. 10# weight gain in the last year. No hot flashes or night sweats.   PHYSICAL EXAM:  BP 100/60   Pulse 60   Ht  (1.676 m)   Wt 178 lb 9.6 oz (81 kg)   BMI 28.83 kg/m   Wt Readings from Last 3 Encounters:  09/24/22 178 lb 9.6 oz (81 kg)  09/18/21 167 lb (75.8 kg)  04/12/21 167 lb 6.4 oz (75.9 kg)    General Appearance:   Alert, cooperative, no distress, appears stated age    Head:   Normocephalic, without obvious abnormality, atraumatic    Eyes:   PERRL, conjunctiva/corneas clear, EOM's intact, fundi benign.    Ears:   Normal TM's and external ear canals    Nose:   Normal, no drainage or sinus tenderness  Throat:   Normal mucosa, no lesions   Neck:   Supple, no lymphadenopathy; thyroid: no enlargement/ tenderness/nodules; no carotid bruit or JVD    Back:   Spine nontender, no curvature, ROM normal, no CVA  tenderness    Lungs:   Clear to auscultation bilaterally without wheezes, rales or ronchi; respirations unlabored    Chest Wall:   No tenderness or deformity    Heart:   Regular rate and rhythm, S1 and S2 normal, no murmur, rub or gallop   Breast Exam:   No tenderness, masses, or nipple discharge or inversion. No axillary lymphadenopathy.   Abdomen:   Soft, non-tender, nondistended, active bowel sounds, no masses, no hepatosplenomegaly    Genitalia:   Normal external genitalia without lesions. +atrophic changes noted at introitus. BUS and vagina normal; Uterus  absent; and adnexa not enlarged, not palpable, nontender, no masses. Pap not performed    Rectal:   Normal tone, no masses or tenderness; heme negative stool  Extremities:   No clubbing, cyanosis or edema.   Pulses:   2+ and symmetric all extremities.   Skin:   Skin color, texture, turgor normal, no rashes or lesions. Purpura at L wrist.  Lymph nodes:   Cervical, supraclavicular, inguinal and axillary nodes normal    Neurologic:   Normal strength, sensation and gait; reflexes 2+ and symmetric throughout                        Psych:   Normal mood, affect, hygiene and grooming   ASSESSMENT/PLAN:  Annual physical exam  Medicare annual wellness visit, subsequent  Osteoporosis, unspecified osteoporosis type, unspecified pathological fracture presence - Cont Prolia, Ca, D, weight-bearing exercise.  To call [facility] to schedule DEXA for next month - Plan: denosumab (PROLIA) injection 60 mg  Vitamin D deficiency - due for recheck; currently taking 5000 IU 3x/week - Plan: VITAMIN D 25 Hydroxy (Vit-D Deficiency, Fractures)  Hypothyroidism, acquired - recently had dose adjusted by Dr. Talmage Nap, and recheck normal (no records). Lower dose is better for bones; pt has no thyroid symptoms  Gastroesophageal reflux disease without esophagitis - counseled re: proper diet. 10# wt gain likely contributing. To increase omeprazole to 40mg  daily. Proper diet  and wt loss reviewed - Plan: omeprazole (PRILOSEC) 40 MG capsule  Medication monitoring encounter - Plan: Comprehensive metabolic panel, CBC with Differential/Platelet  Senile purpura  Need for pneumococcal vaccine - Plan: Pneumococcal conjugate vaccine 20-valent (Prevnar 20)  Breast calcifications on mammogram - noted per [facility]; implications of this is unclear - Plan: CT CARDIAC SCORING (SELF PAY ONLY), Lipid panel  Hypercholesterolemia - pt has overall good lipid profile (excellent HDL, ratio); discussed Ca score, and pt is interested in having this test done (given breast Ca); risks reviewed - Plan: CT CARDIAC SCORING (SELF PAY ONLY), Lipid panel  Bilateral leg discomfort. Doubt related to COVID booster (since hasn't improved, remains constant). Ddx reviewed, reassured regarding normal exam. Recommended trial of compression socks.  In the setting of breast calcifications, and unknown what this means, we did discuss potential calcium score.  She has no risk factors, LDL is >100 but excellent HDL. She is interested in pursuing Ca score. We discussed risks/benefits of test, and understand that a statin will be recommended if calcium score is not zero, or if any aortic atherosclerosis is noted.  Discussed monthly self breast exams and yearly mammograms; at least 30 minutes of aerobic activity at least 5 days/week, weight bearing exercise at least 2x/wk; proper sunscreen use reviewed; healthy diet, including goals of calcium and vitamin D intake and alcohol recommendations (less than or equal to 1 drink/day) reviewed; regular seatbelt use; changing batteries in smoke detectors. Immunization recommendations discussed--high dose flu shots are recommended yearly.  Tdap past due, to get from pharmacy.  RSV vaccine recommended in the Fall (to verify whether or not she got it last year; may not be needed if she did get it, to wait for guidelines in the Fall). COVID vaccine recommended, declined (due to  ongoing leg discomfort) Prevnar-20 given today (since >5 years from her prior pneumonia vaccines) Colon cancer screening is UTD, cologuard due again 04/2025.  DEXA due in May--gets at [facility], to call and schedule.  She was asked to get [place] copies of living will and healthcare power of  attorney. Given new forms last year, per pt request.   MOST form reviewed and updated. Full Code, Full Care.  F/u 1 year, sooner prn At the end of her visit, she mentioned upcoming travel at end of visit, asking for Rx's. She was asked to forward [place] information regarding her itinerary, and set up virtual visit to further discuss in detail.   Medicare Attestation I have personally reviewed: The patient's medical and social history Their use of alcohol, tobacco or illicit drugs Their current medications and supplements The patient's functional ability including ADLs,fall risks, home safety risks, cognitive, and hearing and visual impairment Diet and physical activities Evidence for depression or mood disorders  The patient's weight, height, BMI have been recorded in the chart.  I have made referrals, counseling, and provided education to the patient based on review of the above and I have provided the patient with a written personalized care plan for preventive services.

## 2022-09-20 NOTE — Patient Instructions (Addendum)
HEALTH MAINTENANCE RECOMMENDATIONS:  It is recommended that you get at least 30 minutes of aerobic exercise at least 5 days/week (for weight loss, you may need as much as 60-90 minutes). This can be any activity that gets your heart rate up. This can be divided in 10-15 minute intervals if needed, but try and build up your endurance at least once a week.  Weight bearing exercise is also recommended twice weekly.  Eat a healthy diet with lots of vegetables, fruits and fiber.  "Colorful" foods have a lot of vitamins (ie green vegetables, tomatoes, red peppers, etc).  Limit sweet tea, regular sodas and alcoholic beverages, all of which has a lot of calories and sugar.  Up to 1 alcoholic drink daily may be beneficial for women (unless trying to lose weight, watch sugars).  Drink a lot of water.  Calcium recommendations are 1200-1500 mg daily (1500 mg for postmenopausal women or women without ovaries), and vitamin D 1000 IU daily.  This should be obtained from diet and/or supplements (vitamins), and calcium should not be taken all at once, but in divided doses.  Monthly self breast exams and yearly mammograms for women over the age of 1 is recommended.  Sunscreen of at least SPF 30 should be used on all sun-exposed parts of the skin when outside between the hours of 10 am and 4 pm (not just when at beach or pool, but even with exercise, golf, tennis, and yard work!)  Use a sunscreen that says "broad spectrum" so it covers both UVA and UVB rays, and make sure to reapply every 1-2 hours.  Remember to change the batteries in your smoke detectors when changing your clock times in the spring and fall. Carbon monoxide detectors are recommended for your home.  Use your seat belt every time you are in a car, and please drive safely and not be distracted with cell phones and texting while driving.   Barbara Parker , Thank you for taking time to come for your Medicare Wellness Visit. I appreciate your ongoing  commitment to your health goals. Please review the following plan we discussed and let me know if I can assist you in the future.   This is a list of the screening recommended for you and due dates:  Health Maintenance  Topic Date Due   DTaP/Tdap/Td vaccine (2 - Td or Tdap) 04/08/2021   COVID-19 Vaccine (5 - 2023-24 season) 02/02/2022   Medicare Annual Wellness Visit  09/19/2022   Flu Shot  01/03/2023   Mammogram  07/24/2023   Cologuard (Stool DNA test)  04/09/2025   Pneumonia Vaccine  Completed   DEXA scan (bone density measurement)  Completed   Hepatitis C Screening: USPSTF Recommendation to screen - Ages 15-79 yo.  Completed   Zoster (Shingles) Vaccine  Completed   HPV Vaccine  Aged Out   Colon Cancer Screening  Discontinued    Please bring [place] copies of your Living Will and Healthcare Power of Attorney so that it can be scanned into your medical chart.  Your are due for another bone density scan in May. Please call [facility] to schedule this.  They should fax [place] the order to sign.  You are past due for a tetanus booster.  Please get Tdap from the pharmacy. Wait 2 weeks from today's vaccine.  I recommend getting the new RSV vaccine--if you haven't already gotten it.  Check with the pharmacy (it may be good for 2 years--we have to wait and see what  they ay in the Fall)..  Get this in the Fall from the pharmacy.  You can get this the same day as your flu shot, or separate them by 2 weeks.  Consider trying wearing compression socks to see if that helps with the leg discomfort.  We referred you for calcium score (CT scan).  We discussed that if you have a calcium score that isn't zero, or if you have aortic atherosclerosis noted, these would be indications to start a statin cholesterol-lowering medication.  You are not at high risk for heart disease, but you haven't had any other studies done that might show any atherosclerosis (last chest imaging was a LONG time ago), and you have the  calcifications noted in the breast vessels.

## 2022-09-24 ENCOUNTER — Encounter: Payer: Self-pay | Admitting: Family Medicine

## 2022-09-24 ENCOUNTER — Ambulatory Visit: Payer: Medicare Other | Admitting: Family Medicine

## 2022-09-24 VITALS — BP 100/60 | HR 60 | Ht 66.0 in | Wt 178.6 lb

## 2022-09-24 DIAGNOSIS — E559 Vitamin D deficiency, unspecified: Secondary | ICD-10-CM

## 2022-09-24 DIAGNOSIS — M810 Age-related osteoporosis without current pathological fracture: Secondary | ICD-10-CM

## 2022-09-24 DIAGNOSIS — R921 Mammographic calcification found on diagnostic imaging of breast: Secondary | ICD-10-CM

## 2022-09-24 DIAGNOSIS — E039 Hypothyroidism, unspecified: Secondary | ICD-10-CM | POA: Diagnosis not present

## 2022-09-24 DIAGNOSIS — Z23 Encounter for immunization: Secondary | ICD-10-CM | POA: Diagnosis not present

## 2022-09-24 DIAGNOSIS — D692 Other nonthrombocytopenic purpura: Secondary | ICD-10-CM | POA: Diagnosis not present

## 2022-09-24 DIAGNOSIS — Z0000 Encounter for general adult medical examination without abnormal findings: Secondary | ICD-10-CM

## 2022-09-24 DIAGNOSIS — K219 Gastro-esophageal reflux disease without esophagitis: Secondary | ICD-10-CM | POA: Diagnosis not present

## 2022-09-24 DIAGNOSIS — E7800 Pure hypercholesterolemia, unspecified: Secondary | ICD-10-CM | POA: Diagnosis not present

## 2022-09-24 DIAGNOSIS — Z5181 Encounter for therapeutic drug level monitoring: Secondary | ICD-10-CM

## 2022-09-24 MED ORDER — DENOSUMAB 60 MG/ML ~~LOC~~ SOSY
60.0000 mg | PREFILLED_SYRINGE | Freq: Once | SUBCUTANEOUS | Status: AC
  Administered 2022-09-24: 60 mg via SUBCUTANEOUS

## 2022-09-24 MED ORDER — OMEPRAZOLE 40 MG PO CPDR
40.0000 mg | DELAYED_RELEASE_CAPSULE | Freq: Every day | ORAL | 1 refills | Status: DC
Start: 2022-09-24 — End: 2023-03-20

## 2022-09-25 LAB — CBC WITH DIFFERENTIAL/PLATELET
Basophils Absolute: 0 10*3/uL (ref 0.0–0.2)
Basos: 1 %
EOS (ABSOLUTE): 0.1 10*3/uL (ref 0.0–0.4)
Eos: 2 %
Hematocrit: 38 % (ref 34.0–46.6)
Hemoglobin: 12.5 g/dL (ref 11.1–15.9)
Immature Grans (Abs): 0 10*3/uL (ref 0.0–0.1)
Immature Granulocytes: 0 %
Lymphocytes Absolute: 1.2 10*3/uL (ref 0.7–3.1)
Lymphs: 19 %
MCH: 29.1 pg (ref 26.6–33.0)
MCHC: 32.9 g/dL (ref 31.5–35.7)
MCV: 88 fL (ref 79–97)
Monocytes Absolute: 0.6 10*3/uL (ref 0.1–0.9)
Monocytes: 9 %
Neutrophils Absolute: 4.4 10*3/uL (ref 1.4–7.0)
Neutrophils: 69 %
Platelets: 302 10*3/uL (ref 150–450)
RBC: 4.3 x10E6/uL (ref 3.77–5.28)
RDW: 13.5 % (ref 11.7–15.4)
WBC: 6.4 10*3/uL (ref 3.4–10.8)

## 2022-09-25 LAB — COMPREHENSIVE METABOLIC PANEL
ALT: 14 IU/L (ref 0–32)
AST: 16 IU/L (ref 0–40)
Albumin/Globulin Ratio: 1.6 (ref 1.2–2.2)
Albumin: 3.9 g/dL (ref 3.8–4.8)
Alkaline Phosphatase: 110 IU/L (ref 44–121)
BUN/Creatinine Ratio: 20 (ref 12–28)
BUN: 16 mg/dL (ref 8–27)
Bilirubin Total: 0.3 mg/dL (ref 0.0–1.2)
CO2: 21 mmol/L (ref 20–29)
Calcium: 8.9 mg/dL (ref 8.7–10.3)
Chloride: 105 mmol/L (ref 96–106)
Creatinine, Ser: 0.79 mg/dL (ref 0.57–1.00)
Globulin, Total: 2.5 g/dL (ref 1.5–4.5)
Glucose: 106 mg/dL — ABNORMAL HIGH (ref 70–99)
Potassium: 4.8 mmol/L (ref 3.5–5.2)
Sodium: 142 mmol/L (ref 134–144)
Total Protein: 6.4 g/dL (ref 6.0–8.5)
eGFR: 78 mL/min/{1.73_m2} (ref >59)

## 2022-09-25 LAB — LIPID PANEL
Chol/HDL Ratio: 2.3 ratio (ref 0.0–4.4)
Cholesterol, Total: 198 mg/dL (ref 100–199)
HDL: 87 mg/dL (ref >39)
LDL Chol Calc (NIH): 100 mg/dL — ABNORMAL HIGH (ref 0–99)
Triglycerides: 62 mg/dL (ref 0–149)
VLDL Cholesterol Cal: 11 mg/dL (ref 5–40)

## 2022-09-25 LAB — VITAMIN D 25 HYDROXY (VIT D DEFICIENCY, FRACTURES): Vit D, 25-Hydroxy: 40.6 ng/mL (ref 30.0–100.0)

## 2022-10-01 ENCOUNTER — Encounter: Payer: Self-pay | Admitting: Family Medicine

## 2022-10-02 NOTE — Progress Notes (Unsigned)
Start time: End time:  Virtual Visit via Video Note  I connected with Barbara Parker on 10/02/22 by a video enabled telemedicine application and verified that I am speaking with the correct person using two identifiers.  Location: Patient: *** Provider: office   I discussed the limitations of evaluation and management by telemedicine and the availability of in person appointments. The patient expressed understanding and agreed to proceed.  History of Present Illness:  No chief complaint on file.  Patient presents for travel consult. She is going to [place], [place]    Immunization History  Administered Date(s) Administered   Fluad Quad(high Dose 65+) 02/09/2021   Influenza Split 04/09/2011, 03/25/2012, 03/05/2019   Influenza, High Dose Seasonal PF 03/16/2013, 03/29/2015, 02/22/2016, 03/05/2017, 03/21/2018, 03/09/2020   Influenza-Unspecified 03/22/2022   PFIZER(Purple Top)SARS-COV-2 Vaccination 07/09/2019, 07/30/2019, 03/09/2020   PNEUMOCOCCAL CONJUGATE-20 09/24/2022   Pfizer Covid-19 Vaccine Bivalent Booster 72yrs & up 02/15/2021   Pneumococcal Conjugate-13 03/16/2013   Pneumococcal Polysaccharide-23 04/20/2015   Tdap 04/09/2011   Zoster Recombinat (Shingrix) 03/05/2019, 05/12/2019   Zoster, Live 05/15/2011      Observations/Objective:  There were no vitals taken for this visit.   Assessment and Plan:  TdaP booster Hep A and Hep B Yellow fever vaccine recommended (only needed once)--get at least 10d before travel  Malaria--chloroquine resistant.  Recommend: Atovaquone-proguanil, doxycycline, mefloquine, tafenoquine Typhoid--going to [employer]   Vivotif 1 capsule qod x 4 doses. Complete series >1 week prior to exposure. Give 1 hour prior to meal with cold or lukewarm drink.  Atovaquone/proguanil 250/100mg  1 tablet daily, starting 1-2d prior to exposure (repeat dose if vomiting within 30 mins). Take with food or milk.  Continue for 7 days after  exposure.    Follow Up Instructions:    I discussed the assessment and treatment plan with the patient. The patient was provided an opportunity to ask questions and all were answered. The patient agreed with the plan and demonstrated an understanding of the instructions.   The patient was advised to call back or seek an in-person evaluation if the symptoms worsen or if the condition fails to improve as anticipated.  I spent *** minutes dedicated to the care of this patient, including pre-visit review of records, face to face time, post-visit ordering of testing and documentation.    Lavonda Jumbo, MD

## 2022-10-04 ENCOUNTER — Encounter: Payer: Self-pay | Admitting: Family Medicine

## 2022-10-04 ENCOUNTER — Telehealth (INDEPENDENT_AMBULATORY_CARE_PROVIDER_SITE_OTHER): Payer: Medicare Other | Admitting: Family Medicine

## 2022-10-04 VITALS — Ht 66.0 in | Wt 170.0 lb

## 2022-10-04 DIAGNOSIS — R7301 Impaired fasting glucose: Secondary | ICD-10-CM

## 2022-10-04 DIAGNOSIS — Z7184 Encounter for health counseling related to travel: Secondary | ICD-10-CM | POA: Diagnosis not present

## 2022-10-04 MED ORDER — AZITHROMYCIN 500 MG PO TABS
500.0000 mg | ORAL_TABLET | Freq: Every day | ORAL | 0 refills | Status: AC
Start: 2022-10-04 — End: 2022-10-07

## 2022-10-04 MED ORDER — ATOVAQUONE-PROGUANIL HCL 250-100 MG PO TABS
ORAL_TABLET | ORAL | 0 refills | Status: DC
Start: 2022-10-04 — End: 2023-10-03

## 2022-10-04 MED ORDER — TYPHOID VACCINE PO CPDR
1.0000 | DELAYED_RELEASE_CAPSULE | ORAL | 0 refills | Status: DC
Start: 2022-10-04 — End: 2023-10-03

## 2022-10-04 NOTE — Patient Instructions (Signed)
Get Tdap (tetanus booster) from pharmacy as planned (2 weeks from your recent vaccine).  Start Vivotif now (typhoid vaccine)--1 pill every other day for 4 doses. Be sure to finish the series more than a week before departure.  Take 1 hour prior to meal, with cold or lukewarm drink (not hot).  Start your malaria preventative medication 1-2 days prior to leaving, continue for 7 days upon returning.  Take it once daily while there. Take it with food or milk.  Take the azithromycin if needed for traveler's diarrhea. This medication will also cover respiratory bacterial illnesses (ie sinus infection). Hopefully you won't need it. It is a safer choice than the cipro, which is linked to potentially more serious side effects.  Brink bismuth (ie Pepto Bismol) and imodium to use as well, if needed for diarrhea.  I recommend you looking at the [facility] website for travelers. You can look up the specific area you are going for important information before your trip.

## 2022-10-10 ENCOUNTER — Ambulatory Visit (HOSPITAL_COMMUNITY)
Admission: RE | Admit: 2022-10-10 | Discharge: 2022-10-10 | Disposition: A | Discharge status: Home / Self Care | Payer: Medicare Other | Source: Ambulatory Visit | Attending: Family Medicine | Admitting: Family Medicine

## 2022-10-10 DIAGNOSIS — E7800 Pure hypercholesterolemia, unspecified: Secondary | ICD-10-CM | POA: Insufficient documentation

## 2022-10-10 DIAGNOSIS — R921 Mammographic calcification found on diagnostic imaging of breast: Secondary | ICD-10-CM | POA: Insufficient documentation

## 2022-10-15 ENCOUNTER — Encounter: Payer: Self-pay | Admitting: Podiatry

## 2022-10-15 ENCOUNTER — Ambulatory Visit: Payer: Medicare Other | Admitting: Podiatry

## 2022-10-15 DIAGNOSIS — M7752 Other enthesopathy of left foot: Secondary | ICD-10-CM | POA: Diagnosis not present

## 2022-10-15 DIAGNOSIS — M7751 Other enthesopathy of right foot: Secondary | ICD-10-CM

## 2022-10-15 MED ORDER — TRIAMCINOLONE ACETONIDE 10 MG/ML IJ SUSP
10.0000 mg | Freq: Once | INTRAMUSCULAR | Status: AC
Start: 2022-10-15 — End: 2022-10-15
  Administered 2022-10-15: 10 mg

## 2022-10-15 NOTE — Progress Notes (Signed)
Subjective:   Patient ID: Barbara Parker, female   DOB: 77 y.o.   MRN: [id number]   HPI Patient states she is developed a lot of pain around her big toe joints again with about a year relief   ROS      Objective:  Physical Exam  Neurovascular status intact with inflammation around the first MPJ left over right fluid buildup no crepitus of the joint no range of motion loss currently     Assessment:  Inflammatory capsulitis of the first MPJ bilateral with hallux limitus deformity left over right     Plan:  H&P reviewed sterile prep injected periarticular around the first MPJ 3 mg Dexasone Kenalog 5 mg Xylocaine and advised on wearing good support shoes and reappoint as symptoms indicate

## 2022-10-16 NOTE — Progress Notes (Unsigned)
No chief complaint on file.      As discussed at recent physical: Urinary urge incontinence--Isn't as bothersome as in the past, as long as she remembers to void more frequently. Denies leakage with cough/sneeze. Denies dysuria, hematuria. Up 0-1x/night to void.  PMH, PSH, SH reviewed   ROS:   PHYSICAL EXAM:  There were no vitals taken for this visit.  Wt Readings from Last 3 Encounters:  10/04/22 170 lb (77.1 kg)  09/24/22 178 lb 9.6 oz (81 kg)  09/18/21 167 lb (75.8 kg)      ASSESSMENT/PLAN:

## 2022-10-17 ENCOUNTER — Ambulatory Visit (INDEPENDENT_AMBULATORY_CARE_PROVIDER_SITE_OTHER): Payer: Medicare Other | Admitting: Family Medicine

## 2022-10-17 ENCOUNTER — Encounter: Payer: Self-pay | Admitting: Family Medicine

## 2022-10-17 VITALS — BP 130/76 | HR 68 | Temp 98.0°F | Ht 66.0 in | Wt 178.0 lb

## 2022-10-17 DIAGNOSIS — R350 Frequency of micturition: Secondary | ICD-10-CM

## 2022-10-17 LAB — POCT URINALYSIS DIP (PROADVANTAGE DEVICE)
Bilirubin, UA: NEGATIVE
Blood, UA: NEGATIVE
Glucose, UA: NEGATIVE mg/dL
Ketones, POC UA: NEGATIVE mg/dL
Nitrite, UA: NEGATIVE
Protein Ur, POC: NEGATIVE mg/dL
Specific Gravity, Urine: 1.015
Urobilinogen, Ur: 0.2
pH, UA: 6 (ref 5.0–8.0)

## 2022-10-17 NOTE — Patient Instructions (Signed)
Be sure to drink plenty of water. It is possible that the medication contributed to the urine odor or symptoms. If you have increased itching, especially if any vaginal dischrage, you may want to consider treating for a yeast infection.  Give it a little more time. I'm glad that your urinary frequency seems to be improving. You had trace amount of white cells in the urine. We are sending it for culture to rule out an infection prior to your big trip.  If the culture is negative, this means no infection.  If it shows something, we will send in an antibiotic, and if we don't have the full result back by Monday, we will treat presumptively (since you may leave prior to getting the antibiotic sensitivity results back).

## 2022-10-19 LAB — URINE CULTURE

## 2022-10-22 MED ORDER — NITROFURANTOIN MONOHYD MACRO 100 MG PO CAPS
100.0000 mg | ORAL_CAPSULE | Freq: Two times a day (BID) | ORAL | 0 refills | Status: DC
Start: 2022-10-22 — End: 2023-10-03

## 2022-12-12 DIAGNOSIS — M25562 Pain in left knee: Secondary | ICD-10-CM | POA: Diagnosis not present

## 2022-12-17 DIAGNOSIS — H401131 Primary open-angle glaucoma, bilateral, mild stage: Secondary | ICD-10-CM | POA: Diagnosis not present

## 2023-02-22 ENCOUNTER — Telehealth: Payer: Self-pay | Admitting: Internal Medicine

## 2023-02-22 NOTE — Telephone Encounter (Signed)
Left message for pt to call me back regarding Prolia. PA is already on file  Due after 03/26/23  Cost is $325 at check- in. Will order through physician services once scheduled

## 2023-02-26 NOTE — Telephone Encounter (Signed)
Left message for pt to call me back 

## 2023-02-28 NOTE — Telephone Encounter (Signed)
Left message for pt to call me back 

## 2023-03-05 NOTE — Telephone Encounter (Signed)
Pt is scheduled for October 23rd @ 8:30am. She would like it to be set out at 8am   I have ordered this through Physicians services

## 2023-03-19 DIAGNOSIS — H401131 Primary open-angle glaucoma, bilateral, mild stage: Secondary | ICD-10-CM | POA: Diagnosis not present

## 2023-03-20 ENCOUNTER — Other Ambulatory Visit: Payer: Self-pay | Admitting: Family Medicine

## 2023-03-20 DIAGNOSIS — K219 Gastro-esophageal reflux disease without esophagitis: Secondary | ICD-10-CM

## 2023-03-27 ENCOUNTER — Other Ambulatory Visit (INDEPENDENT_AMBULATORY_CARE_PROVIDER_SITE_OTHER): Payer: Medicare Other

## 2023-03-27 DIAGNOSIS — M810 Age-related osteoporosis without current pathological fracture: Secondary | ICD-10-CM

## 2023-03-27 MED ORDER — DENOSUMAB 60 MG/ML ~~LOC~~ SOSY
60.0000 mg | PREFILLED_SYRINGE | Freq: Once | SUBCUTANEOUS | Status: AC
Start: 2023-03-27 — End: 2023-03-27
  Administered 2023-03-27: 60 mg via SUBCUTANEOUS

## 2023-03-30 DIAGNOSIS — M25531 Pain in right wrist: Secondary | ICD-10-CM | POA: Diagnosis not present

## 2023-05-08 DIAGNOSIS — R519 Headache, unspecified: Secondary | ICD-10-CM | POA: Diagnosis not present

## 2023-05-08 DIAGNOSIS — G8929 Other chronic pain: Secondary | ICD-10-CM | POA: Diagnosis not present

## 2023-05-14 DIAGNOSIS — R519 Headache, unspecified: Secondary | ICD-10-CM | POA: Diagnosis not present

## 2023-05-21 DIAGNOSIS — M5412 Radiculopathy, cervical region: Secondary | ICD-10-CM | POA: Diagnosis not present

## 2023-06-19 DIAGNOSIS — H401133 Primary open-angle glaucoma, bilateral, severe stage: Secondary | ICD-10-CM | POA: Diagnosis not present

## 2023-06-26 DIAGNOSIS — E890 Postprocedural hypothyroidism: Secondary | ICD-10-CM | POA: Diagnosis not present

## 2023-06-27 DIAGNOSIS — M4722 Other spondylosis with radiculopathy, cervical region: Secondary | ICD-10-CM | POA: Diagnosis not present

## 2023-07-26 DIAGNOSIS — R682 Dry mouth, unspecified: Secondary | ICD-10-CM | POA: Diagnosis not present

## 2023-07-26 DIAGNOSIS — E890 Postprocedural hypothyroidism: Secondary | ICD-10-CM | POA: Diagnosis not present

## 2023-07-26 DIAGNOSIS — M810 Age-related osteoporosis without current pathological fracture: Secondary | ICD-10-CM | POA: Diagnosis not present

## 2023-07-26 DIAGNOSIS — K219 Gastro-esophageal reflux disease without esophagitis: Secondary | ICD-10-CM | POA: Diagnosis not present

## 2023-07-26 DIAGNOSIS — R631 Polydipsia: Secondary | ICD-10-CM | POA: Diagnosis not present

## 2023-07-26 DIAGNOSIS — C73 Malignant neoplasm of thyroid gland: Secondary | ICD-10-CM | POA: Diagnosis not present

## 2023-07-29 DIAGNOSIS — Z1231 Encounter for screening mammogram for malignant neoplasm of breast: Secondary | ICD-10-CM | POA: Diagnosis not present

## 2023-07-29 DIAGNOSIS — H401133 Primary open-angle glaucoma, bilateral, severe stage: Secondary | ICD-10-CM | POA: Diagnosis not present

## 2023-07-29 LAB — HM MAMMOGRAPHY

## 2023-08-08 ENCOUNTER — Telehealth: Payer: Self-pay | Admitting: Internal Medicine

## 2023-08-08 DIAGNOSIS — M810 Age-related osteoporosis without current pathological fracture: Secondary | ICD-10-CM

## 2023-08-08 MED ORDER — DENOSUMAB 60 MG/ML ~~LOC~~ SOSY
60.0000 mg | PREFILLED_SYRINGE | Freq: Once | SUBCUTANEOUS | Status: AC
Start: 2023-09-26 — End: 2023-10-02
  Administered 2023-10-02: 60 mg via SUBCUTANEOUS

## 2023-08-08 NOTE — Telephone Encounter (Signed)
 See prolia referral

## 2023-08-19 ENCOUNTER — Telehealth: Payer: Self-pay

## 2023-08-19 ENCOUNTER — Other Ambulatory Visit (HOSPITAL_COMMUNITY): Payer: Self-pay

## 2023-08-19 NOTE — Telephone Encounter (Signed)
 Prolia VOB initiated via [email or web address]  Next Prolia inj DUE:  09/25/23  Per test claim: Prolia copay $405.54

## 2023-08-20 DIAGNOSIS — M5412 Radiculopathy, cervical region: Secondary | ICD-10-CM | POA: Diagnosis not present

## 2023-08-23 NOTE — Telephone Encounter (Signed)
 Barbara Parker

## 2023-08-26 ENCOUNTER — Other Ambulatory Visit (HOSPITAL_COMMUNITY): Payer: Self-pay

## 2023-08-26 NOTE — Telephone Encounter (Signed)
 Pt ready for scheduling for Prolia on or after : 09/26/23  Option# 1: Buy/Bill (Office supplied medication)  Out-of-pocket cost due at time of clinic visit: $356  Number of injection/visits approved: 2  Primary: Advertising copywriter - Medicare Prolia co-insurance: 20% Admin fee co-insurance: $20  Secondary: N/A Prolia co-insurance:  Admin fee co-insurance:   Medical Benefit Details: Date Benefits were checked: 08/19/23 Deductible: no/ Coinsurance: 20%/ Admin Fee: $20  Prior Auth: Approved PA# [id number] Expiration Date: 08/26/23 to 08/25/24  # of doses approved: 2 ----------------------------------------------------------------------- Option# 2- Med Obtained from pharmacy:  Pharmacy benefit: Copay $405.54 (Paid to pharmacy) Admin Fee: $20 (Pay at clinic)  Prior Auth: -- PA# Expiration Date:   # of doses approved:   If patient wants fill through the pharmacy benefit please send prescription to:  [facility] , and include estimated need by date in rx notes. Pharmacy will ship medication directly to the office.  Patient not eligible for Prolia Copay Card. Copay Card can make patient's cost as little as $25. Link to apply: [email or web address]  ** This summary of benefits is an estimation of the patient's out-of-pocket cost. Exact cost may very based on individual plan coverage.

## 2023-09-27 DIAGNOSIS — H47291 Other optic atrophy, right eye: Secondary | ICD-10-CM | POA: Diagnosis not present

## 2023-09-27 DIAGNOSIS — H2513 Age-related nuclear cataract, bilateral: Secondary | ICD-10-CM | POA: Diagnosis not present

## 2023-09-27 DIAGNOSIS — H401432 Capsular glaucoma with pseudoexfoliation of lens, bilateral, moderate stage: Secondary | ICD-10-CM | POA: Diagnosis not present

## 2023-10-02 ENCOUNTER — Other Ambulatory Visit (INDEPENDENT_AMBULATORY_CARE_PROVIDER_SITE_OTHER)

## 2023-10-02 DIAGNOSIS — M810 Age-related osteoporosis without current pathological fracture: Secondary | ICD-10-CM | POA: Diagnosis not present

## 2023-10-02 NOTE — Progress Notes (Signed)
 Chief Complaint  Patient presents with   Medicare Wellness    Nonfasting (patient forgot) AWV/CPE no pelvic. Will come back tomorrow am for labs. Had typhoid vaccine, not in [facility]. Patient thinks she had it in May with Tdap-she will let me know. No new concerns.    Barbara Parker is a 78 y.o. female who presents for annual physical exam, Medicare wellness visit and follow-up on chronic medical conditions.  Traveled to [place] last year. Had a wonderful trip.  In October she fell in the bathroom, hit her head, +LOC. She did not go to ER or seek evaluation at that time. She saw Dr. Crecencio Dodge (neurosurgeon) in December. She was still having headaches and some nausea. MRI was done which was reportedly normal (not in chart).   She denies any ongoing headaches.  Last year we discussed finding of breast calcifications, and recommended coronary calcium score to better evaluate risk of CAD. Coronary calcium scan 10/2022 showed: IMPRESSION: Coronary calcium score of 0.6. This was 29th percentile for age-, race-, and sex-matched controls. Overread: IMPRESSION: 1. Small hiatal hernia. 2. Calcified left hilar lymph node typical of prior granulomatous disease, needing no further imaging follow-up.  We did not start any statin, given very low calcium score and no aortic atherosclerosis. Last lipids were good. Lab Results  Component Value Date   CHOL 198 09/24/2022   HDL 87 09/24/2022   LDLCALC 100 (H) 09/24/2022   TRIG 62 09/24/2022   CHOLHDL 2.3 09/24/2022   Fasting glucose was 106 last year.  She has been trying to limit sweets and sugar. She isn't fasting today, will return for labs.  Osteoporosis:  She has been getting Prolia  injections and tolerating without side effects. Last injection was yesterday. She had DEXA 05/2015 which showed osteoporosis in the spine, T-3.4.  She had Reclast  infusion on 07/05/15, followed by vomiting and diarrhea and severe aches in the joints (pain lasted up  to 2 weeks).   Last DEXA was 10/2020, T-2.7 spine, -2.4 L fem neck, -2.1 R fem neck (stable spine, improved at hips, per report). She has yogurt, cheese, milk daily (2 servings/day).  Gets weight-bearing exercise regularly (weights at home).   Vitamin D  deficiency: Last level was normal at 40.6 in 09/2022 when taking 5000 IU every other day.  Prior to that it was low at 24.2 in 09/2021, when taking vitamin D3 2000 IU only 2-3x/week (but doubled up on it once a week).  She is currently taking 5000 IU every other day.   Component Ref Range & Units (hover) 1 yr ago 2 yr ago 3 yr ago 4 yr ago 5 yr ago 6 yr ago 7 yr ago  Vit D, 25-Hydroxy 40.6 24.2 Low  CM 29.3 Low  CM 30.2 CM 21.2 Low  CM 22.2 Low  CM 30 R, CM    Hypothyroidism:  She is under the care of Dr. Ronelle Coffee. She was last seen in 07/2023, and TSH was normal at 1.560 in 06/2023. No changes were made, she continues on Synthroid  112 mcg daily.  Denies changes to hair/nails/skin/bowels/energy/moods.   GERD/HH: She had been taking omeprazole  40 mg daily at last visit. She previously was able to decrease to 20 mg, and at one point was able to do okay with it every other day, with an extra dose prn. Reflux was worse after she had gained some weight.  She has hiatal hernia (UGI in 06/2009 showed HH and GE reflux). She has since been able to  decrease to 40 mg every other day, and be more careful with her diet.  Occasional needs to take 40 mg on the skipped day, related to symptoms (once a week or so). She denies dysphagia or early satiety.  Decreased hearing in the right > left ears.  She has bilateral hearing aids.  Denies changes to her hearing.  She only uses them occasionally, states they are old. Plans to follow-up to have exam.   Urinary urge incontinence--Isn't as bothersome as in the past, as long as she remembers to void more frequently. Denies leakage with cough/sneeze.Denies dysuria, hematuria. Up 1x/night to void.   Glaucoma, under the care of  Dr. Burundi.   Immunization History  Administered Date(s) Administered   Fluad Quad(high Dose 65+) 02/09/2021   Hepatitis A 06/21/1998, 10/03/1999   Hepatitis B, ADULT 08/27/2000, 09/23/2000, 12/28/2002   Influenza Split 04/09/2011, 03/25/2012, 03/05/2019   Influenza, High Dose Seasonal PF 03/16/2013, 03/29/2015, 02/22/2016, 03/05/2017, 03/21/2018, 03/09/2020   Influenza-Unspecified 03/22/2022   PFIZER(Purple Top)SARS-COV-2 Vaccination 07/09/2019, 07/30/2019, 03/09/2020   PNEUMOCOCCAL CONJUGATE-20 09/24/2022   Pfizer Covid-19 Vaccine Bivalent Booster 47yrs & up 02/15/2021   Pneumococcal Conjugate-13 03/16/2013   Pneumococcal Polysaccharide-23 04/20/2015   Tdap 04/09/2011, 10/04/2022   Typhoid Inactivated 07/21/2012   Yellow Fever 03/12/2000   Zoster Recombinant(Shingrix) 03/05/2019, 05/12/2019   Zoster, Live 05/15/2011   Last Pap smear: 2004; s/p hysterectomy (for fibroids) Last mammogram: 07/2023 at [facility], normal Last colonoscopy: 04/2012  Dr. Tova Fresh; negative Cologuard 04/2022 Last DEXA: 10/2020 T-2.7 at spine, -2.4 L fem neck, -2.1 R fem neck Dentist: twice yearly   Ophtho: twice yearly Exercise:  Walking (30-35 mins 3x/week). Does a lot of walking and stairs at work. Does weights at home daily (5# each).   Lipids: Lab Results  Component Value Date   CHOL 198 09/24/2022   HDL 87 09/24/2022   LDLCALC 100 (H) 09/24/2022   TRIG 62 09/24/2022   CHOLHDL 2.3 09/24/2022   Patient Care Team: Roosvelt Colla, MD as PCP - General (Family Medicine) Bur[place]i, Heather, OD (Optometry) Tami Falcon, MD as Consulting Physician (Gastroenterology) Tasia Farr, MD as Referring Physician (Endocrinology) Orvan Blanch, MD as Consulting Physician (Orthopedic Surgery) Dayne Even, MD as Consulting Physician (Orthopedic Surgery) Janita Mellow, MD as Consulting Physician (Otolaryngology) Dorisann Garre, Thornell Flirt, MD as Referring Physician (Dermatology) Celia Coles Angus Kenning, DPM as Consulting Physician  (Podiatry) Dentist: Dr. Neldon Baltimore  Depression Screening: Flowsheet Row Office Visit from 10/03/2023 in [facility]  PHQ-2 Total Score 0        Falls screen:     10/03/2023    8:48 AM 09/24/2022    8:30 AM 09/18/2021    8:31 AM 04/12/2021    1:31 PM 09/05/2020    8:36 AM  Fall Risk   Falls in the past year? 1 0 0 0 1  Number falls in past yr: 1 0 0 0 0  Comment end of October, foot got caught in pajamas fell on tub and lost consciousness.    tripped and fell last week  Injury with Fall? 1 0 0 0 0  Comment Cut head open on bathtub, thinks she had concusion. Closed it up with NuSkin. Saw Dr. Barabara Levering a few weeks later for follow up. He did MRI and was ok.      Risk for fall due to :  No Fall Risks No Fall Risks No Fall Risks Other (Comment)  Risk for fall due to: Comment     patient tripped  Follow up  Falls evaluation completed Falls evaluation completed Falls evaluation completed Falls evaluation completed     Functional Status Survey: Is the patient deaf or have difficulty hearing?: Yes Does the patient have difficulty seeing, even when wearing glasses/contacts?: No Does the patient have difficulty concentrating, remembering, or making decisions?: No Does the patient have difficulty walking or climbing stairs?: No Does the patient have difficulty dressing or bathing?: No Does the patient have difficulty doing errands alone such as visiting a doctor's office or shopping?: No  Mini-Cog Scoring: 5     End of Life Discussion:  Patient has a living will and medical power of attorney; copies have not been received.  She filled them out, gave to her daughter, but doesn't have a copy herself. She reports today that she has completed copies, and will bring them.    PMH, PSH, SH and FH were reviewed and updated  Outpatient Encounter Medications as of 10/03/2023  Medication Sig Note   brimonidine-timolol (COMBIGAN) 0.2-0.5 % ophthalmic solution Apply 1 drop to eye 2 (two) times  daily.    latanoprost (XALATAN) 0.005 % ophthalmic solution Place 1 drop into both eyes at bedtime.    levothyroxine  (SYNTHROID ) 112 MCG tablet Take 1 tablet by mouth daily. 10/03/2023: brand   omeprazole  (PRILOSEC) 40 MG capsule TAKE 1 CAPSULE (40 MG TOTAL) BY MOUTH DAILY. 10/03/2023: Every other day   VITAMIN D , CHOLECALCIFEROL, PO Take 5,000 Int'l Units/hr by mouth 3 (three) times a week. 10/03/2023: Taking every other day   [DISCONTINUED] cholecalciferol (VITAMIN D ) 1000 units tablet Take 2,000 Units by mouth daily.  10/04/2022: 2-3 times a week   acetaminophen (TYLENOL) 325 MG tablet Take 325 mg by mouth every 6 (six) hours as needed. (Patient not taking: Reported on 10/03/2023) 10/03/2023: As needed   [DISCONTINUED] atovaquone -proguanil (MALARONE ) 250-100 MG TABS tablet Take one by mouth daily, starting 1-2 days prior to trip, continuing for 7 days after. Take with food or milk (Patient not taking: Reported on 10/17/2022)    [DISCONTINUED] levothyroxine  (SYNTHROID ) 100 MCG tablet Take 100 mcg by mouth daily before breakfast. 09/24/2022: brand   [DISCONTINUED] nitrofurantoin , macrocrystal-monohydrate, (MACROBID ) 100 MG capsule Take 1 capsule (100 mg total) by mouth 2 (two) times daily.    [DISCONTINUED] typhoid (VIVOTIF) DR capsule Take 1 capsule by mouth every other day. Take 1 hour before food; do not take with hot liquids (Patient not taking: Reported on 10/17/2022) 10/17/2022: finished   Facility-Administered Encounter Medications as of 10/03/2023  Medication   denosumab  (PROLIA ) injection 60 mg   [COMPLETED] denosumab  (PROLIA ) injection 60 mg   Allergies  Allergen Reactions   Codeine Anaphylaxis   Reclast  [Zoledronic  Acid] Diarrhea and Nausea And Vomiting    Joint pain   Prednisone     Nausea and vomiting    ROS: The patient denies anorexia, fever, vision changes (some decrease in peripheral vision on right); denies ear pain, sore throat, breast concerns, chest pain, palpitations, dizziness,  syncope, dyspnea on exertion, cough, swelling, nausea, vomiting, constipation, abdominal pain, melena, hematochezia, indigestion/heartburn, urinary incontinence, hematuria, dysuria, vaginal bleeding, discharge, odor or itch, genital lesions, numbness, tingling, weakness, tremor, suspicious skin lesions, depression, anxiety, abnormal bleeding, or enlarged lymph nodes.   Mouth dryness (pt states Dr. Ronelle Coffee thinks may have been related to the radiation). Chronically bruises easily, unchanged. Decreased hearing, has bilateral hearing aids. Occasional aching in her knees. No longer has tightness in her legs. Reflux is well controlled. No hot flashes or night sweats. HA after injury in October  resolved. 3 weeks ago she had tenderness L nipple. Noticed a white bump a few days ago, used neosporin and it resolved. Currently denies breast concerns.     PHYSICAL EXAM:  BP 106/62   Pulse 64   Ht 5\' 6"  (1.676 m)   Wt 174 lb 9.6 oz (79.2 kg)   BMI 28.18 kg/m   Wt Readings from Last 3 Encounters:  10/03/23 174 lb 9.6 oz (79.2 kg)  10/17/22 178 lb (80.7 kg)  10/04/22 170 lb (77.1 kg)   09/24/22 178 lb 9.6 oz (81 kg)  09/18/21 167 lb (75.8 kg)  04/12/21 167 lb 6.4 oz (75.9 kg)    General Appearance:   Alert, cooperative, no distress, appears stated age    Head:   Normocephalic, without obvious abnormality, atraumatic    Eyes:   PERRL, conjunctiva/corneas clear, EOM's intact, fundi benign.    Ears:   Normal TM's and external ear canals    Nose:   Normal, no drainage or sinus tenderness  Throat:   Normal mucosa, no lesions   Neck:   Supple, no lymphadenopathy; thyroid : no enlargement/ tenderness/nodules; no carotid bruit or JVD    Back:   Spine nontender, no curvature, ROM normal, no CVA tenderness    Lungs:   Clear to auscultation bilaterally without wheezes, rales or ronchi; respirations unlabored    Chest Wall:   No tenderness or deformity    Heart:   Regular rate and rhythm, S1 and S2  normal, no murmur, rub or gallop   Breast Exam:   No tenderness, masses, or nipple discharge or inversion. No axillary lymphadenopathy. There is a small white spot inferiorly on the left nipple. It is nontender, no erythema, no drainage/crusting  Abdomen:   Soft, non-tender, nondistended, active bowel sounds, no masses, no hepatosplenomegaly    Genitalia:   Not examined today  Rectal:   Not examined today  Extremities:   No clubbing, cyanosis or edema.   Pulses:   2+ and symmetric all extremities.   Skin:   Skin color, texture, turgor normal, no rashes or lesions. Purpura at forearms, hands.  Lymph nodes:   Cervical, supraclavicular, inguinal and axillary nodes normal    Neurologic:   Normal strength, sensation and gait; reflexes 2+ and symmetric throughout                        Psych:   Normal mood, affect, hygiene and grooming    ASSESSMENT/PLAN:  Annual physical exam  Medicare annual wellness visit, subsequent  Impaired fasting glucose - glu 106 last year. To limit sugar/carbs, encouraged regular exercise and wt loss - Plan: CMP14+EGFR, Hemoglobin A1c  Osteoporosis, unspecified osteoporosis type, unspecified pathological fracture presence - cont prolia , adequate calcium and D intake, weight-bearing exercise. Past due for DEXA, reminded to schedule  Vitamin D  deficiency - adequately replaced per labs last year on same dosing, continue  Hypothyroidism, acquired - adequately replaced, monitored/treated by Dr. Ronelle Coffee  Hypercholesterolemia - continue low cholesterol diet, due for recheck - Plan: Lipid panel  Gastroesophageal reflux disease without esophagitis - Proper diet and wt loss reviewed. Discussed potential to try and taper to OTC dosing, with 2nd dose prn.  Cyst of left nipple - very small cyst/pustule at inferior L nipple.  Warm compresses recommended.  f/u if increases in size or painful  Medication monitoring encounter - Plan: CMP14+EGFR, CBC with  Differential/Platelet   GERD--has plenty of 40 mg omeprazole , refill not needed. Plan is  to decrease back to OTC dosing, if able (and not automatically refill the 40 mg dose).   Discussed monthly self breast exams and yearly mammograms; at least 30 minutes of aerobic activity at least 5 days/week, weight bearing exercise at least 2x/wk; proper sunscreen use reviewed; healthy diet, including goals of calcium and vitamin D  intake and alcohol recommendations (less than or equal to 1 drink/day) reviewed; regular seatbelt use; changing batteries in smoke detectors. Immunization recommendations discussed--high dose flu shots are recommended yearly.   RSV vaccine recommended in the Fall COVID vaccine recommended. Colon cancer screening is UTD, cologuard due again 04/2025.  DEXA is past due. Will fax order to [facility].  She was asked to get us  copies of living will and healthcare power of attorney. MOST form reviewed and updated. Full Code, Full Care.  F/u 1 year, sooner prn   Medicare Attestation I have personally reviewed: The patient's medical and social history Their use of alcohol, tobacco or illicit drugs Their current medications and supplements The patient's functional ability including ADLs,fall risks, home safety risks, cognitive, and hearing and visual impairment Diet and physical activities Evidence for depression or mood disorders  The patient's weight, height, BMI have been recorded in the chart.  I have made referrals, counseling, and provided education to the patient based on review of the above and I have provided the patient with a written personalized care plan for preventive services.

## 2023-10-03 ENCOUNTER — Encounter: Payer: Self-pay | Admitting: Family Medicine

## 2023-10-03 ENCOUNTER — Ambulatory Visit: Payer: Medicare Other | Admitting: Family Medicine

## 2023-10-03 VITALS — BP 106/62 | HR 64 | Ht 66.0 in | Wt 174.6 lb

## 2023-10-03 DIAGNOSIS — K219 Gastro-esophageal reflux disease without esophagitis: Secondary | ICD-10-CM | POA: Diagnosis not present

## 2023-10-03 DIAGNOSIS — E7800 Pure hypercholesterolemia, unspecified: Secondary | ICD-10-CM | POA: Diagnosis not present

## 2023-10-03 DIAGNOSIS — N6002 Solitary cyst of left breast: Secondary | ICD-10-CM

## 2023-10-03 DIAGNOSIS — E039 Hypothyroidism, unspecified: Secondary | ICD-10-CM | POA: Diagnosis not present

## 2023-10-03 DIAGNOSIS — Z0000 Encounter for general adult medical examination without abnormal findings: Secondary | ICD-10-CM | POA: Diagnosis not present

## 2023-10-03 DIAGNOSIS — E559 Vitamin D deficiency, unspecified: Secondary | ICD-10-CM | POA: Diagnosis not present

## 2023-10-03 DIAGNOSIS — Z5181 Encounter for therapeutic drug level monitoring: Secondary | ICD-10-CM | POA: Diagnosis not present

## 2023-10-03 DIAGNOSIS — M810 Age-related osteoporosis without current pathological fracture: Secondary | ICD-10-CM

## 2023-10-03 DIAGNOSIS — R7301 Impaired fasting glucose: Secondary | ICD-10-CM | POA: Diagnosis not present

## 2023-10-03 NOTE — Patient Instructions (Addendum)
 HEALTH MAINTENANCE RECOMMENDATIONS:  It is recommended that you get at least 30 minutes of aerobic exercise at least 5 days/week (for weight loss, you may need as much as 60-90 minutes). This can be any activity that gets your heart rate up. This can be divided in 10-15 minute intervals if needed, but try and build up your endurance at least once a week.  Weight bearing exercise is also recommended twice weekly.  Eat a healthy diet with lots of vegetables, fruits and fiber.  "Colorful" foods have a lot of vitamins (ie green vegetables, tomatoes, red peppers, etc).  Limit sweet tea, regular sodas and alcoholic beverages, all of which has a lot of calories and sugar.  Up to 1 alcoholic drink daily may be beneficial for women (unless trying to lose weight, watch sugars).  Drink a lot of water.  Calcium recommendations are 1200-1500 mg daily (1500 mg for postmenopausal women or women without ovaries), and vitamin D  1000 IU daily.  This should be obtained from diet and/or supplements (vitamins), and calcium should not be taken all at once, but in divided doses.  Monthly self breast exams and yearly mammograms for women over the age of 71 is recommended.  Sunscreen of at least SPF 30 should be used on all sun-exposed parts of the skin when outside between the hours of 10 am and 4 pm (not just when at beach or pool, but even with exercise, golf, tennis, and yard work!)  Use a sunscreen that says "broad spectrum" so it covers both UVA and UVB rays, and make sure to reapply every 1-2 hours.  Remember to change the batteries in your smoke detectors when changing your clock times in the spring and fall. Carbon monoxide detectors are recommended for your home.  Use your seat belt every time you are in a car, and please drive safely and not be distracted with cell phones and texting while driving.   Barbara Parker , Thank you for taking time to come for your Medicare Wellness Visit. I appreciate your ongoing  commitment to your health goals. Please review the following plan we discussed and let me know if I can assist you in the future.   This is a list of the screening recommended for you and due dates:  Health Maintenance  Topic Date Due   COVID-19 Vaccine (5 - 2024-25 season) 02/03/2023   Flu Shot  01/03/2024   Mammogram  07/28/2024   Medicare Annual Wellness Visit  10/02/2024   Cologuard (Stool DNA test)  04/09/2025   DTaP/Tdap/Td vaccine (3 - Td or Tdap) 10/03/2032   Pneumonia Vaccine  Completed   DEXA scan (bone density measurement)  Completed   Hepatitis C Screening  Completed   Zoster (Shingles) Vaccine  Completed   HPV Vaccine  Aged Out   Meningitis B Vaccine  Aged Out   Colon Cancer Screening  Discontinued   Consider trying to wean down to the over-the-counter omeprazole  (20 mg daily). You can use that as needed on the days that you develop symptoms, and when you get lower on your prescription supply, switch over to the 20 mg.  You can double up if needed for symptoms.  Schedule your bone density test with Jodi Munroe (was due last year).  Please get the RSV in the Fall. You can get this the same day as your flu shot (or separate by 2 weeks). COVID boosters are recommended.  Please bring us  copies of your Living Will and Healthcare Power of [phone]  so that it can be scanned into your medical chart.

## 2023-10-04 ENCOUNTER — Other Ambulatory Visit: Payer: Self-pay | Admitting: Internal Medicine

## 2023-10-04 ENCOUNTER — Other Ambulatory Visit

## 2023-10-04 DIAGNOSIS — E7800 Pure hypercholesterolemia, unspecified: Secondary | ICD-10-CM

## 2023-10-04 DIAGNOSIS — Z5181 Encounter for therapeutic drug level monitoring: Secondary | ICD-10-CM

## 2023-10-04 DIAGNOSIS — R7301 Impaired fasting glucose: Secondary | ICD-10-CM

## 2023-10-04 LAB — LIPID PANEL

## 2023-10-04 NOTE — Progress Notes (Signed)
 Ordered prolia  and sent to [facility] going forward

## 2023-10-05 ENCOUNTER — Encounter: Payer: Self-pay | Admitting: Family Medicine

## 2023-10-05 LAB — CMP14+EGFR
ALT: 17 IU/L (ref 0–32)
AST: 17 IU/L (ref 0–40)
Albumin: 4.2 g/dL (ref 3.8–4.8)
Alkaline Phosphatase: 133 IU/L — ABNORMAL HIGH (ref 44–121)
BUN/Creatinine Ratio: 16 (ref 12–28)
BUN: 14 mg/dL (ref 8–27)
Bilirubin Total: 0.5 mg/dL (ref 0.0–1.2)
CO2: 23 mmol/L (ref 20–29)
Calcium: 9.1 mg/dL (ref 8.7–10.3)
Chloride: 106 mmol/L (ref 96–106)
Creatinine, Ser: 0.88 mg/dL (ref 0.57–1.00)
Globulin, Total: 2.3 g/dL (ref 1.5–4.5)
Glucose: 95 mg/dL (ref 70–99)
Potassium: 4.5 mmol/L (ref 3.5–5.2)
Sodium: 144 mmol/L (ref 134–144)
Total Protein: 6.5 g/dL (ref 6.0–8.5)
eGFR: 68 mL/min/{1.73_m2} (ref >59)

## 2023-10-05 LAB — CBC WITH DIFFERENTIAL/PLATELET
Basophils Absolute: 0 10*3/uL (ref 0.0–0.2)
Basos: 1 %
EOS (ABSOLUTE): 0.1 10*3/uL (ref 0.0–0.4)
Eos: 2 %
Hematocrit: 38.4 % (ref 34.0–46.6)
Hemoglobin: 13 g/dL (ref 11.1–15.9)
Immature Grans (Abs): 0 10*3/uL (ref 0.0–0.1)
Immature Granulocytes: 1 %
Lymphocytes Absolute: 1.2 10*3/uL (ref 0.7–3.1)
Lymphs: 17 %
MCH: 30.2 pg (ref 26.6–33.0)
MCHC: 33.9 g/dL (ref 31.5–35.7)
MCV: 89 fL (ref 79–97)
Monocytes Absolute: 0.7 10*3/uL (ref 0.1–0.9)
Monocytes: 10 %
Neutrophils Absolute: 5.2 10*3/uL (ref 1.4–7.0)
Neutrophils: 69 %
Platelets: 317 10*3/uL (ref 150–450)
RBC: 4.31 x10E6/uL (ref 3.77–5.28)
RDW: 13.2 % (ref 11.7–15.4)
WBC: 7.3 10*3/uL (ref 3.4–10.8)

## 2023-10-05 LAB — LIPID PANEL
Cholesterol, Total: 200 mg/dL — ABNORMAL HIGH (ref 100–199)
HDL: 71 mg/dL (ref >39)
LDL CALC COMMENT:: 2.8 ratio (ref 0.0–4.4)
LDL Chol Calc (NIH): 112 mg/dL — ABNORMAL HIGH (ref 0–99)
Triglycerides: 96 mg/dL (ref 0–149)
VLDL Cholesterol Cal: 17 mg/dL (ref 5–40)

## 2023-10-05 LAB — HEMOGLOBIN A1C
Est. average glucose Bld gHb Est-mCnc: 117 mg/dL
Hgb A1c MFr Bld: 5.7 % — ABNORMAL HIGH (ref 4.8–5.6)

## 2023-10-09 DIAGNOSIS — H401432 Capsular glaucoma with pseudoexfoliation of lens, bilateral, moderate stage: Secondary | ICD-10-CM | POA: Diagnosis not present

## 2023-10-10 DIAGNOSIS — Z8262 Family history of osteoporosis: Secondary | ICD-10-CM | POA: Diagnosis not present

## 2023-10-10 DIAGNOSIS — M8588 Other specified disorders of bone density and structure, other site: Secondary | ICD-10-CM | POA: Diagnosis not present

## 2023-10-10 LAB — HM DEXA SCAN

## 2023-10-14 ENCOUNTER — Encounter: Payer: Self-pay | Admitting: Family Medicine

## 2023-10-30 DIAGNOSIS — H401133 Primary open-angle glaucoma, bilateral, severe stage: Secondary | ICD-10-CM | POA: Diagnosis not present

## 2023-11-21 DIAGNOSIS — R519 Headache, unspecified: Secondary | ICD-10-CM | POA: Diagnosis not present

## 2023-12-03 DIAGNOSIS — Z9181 History of falling: Secondary | ICD-10-CM | POA: Diagnosis not present

## 2023-12-03 DIAGNOSIS — R2689 Other abnormalities of gait and mobility: Secondary | ICD-10-CM | POA: Diagnosis not present

## 2023-12-03 DIAGNOSIS — M542 Cervicalgia: Secondary | ICD-10-CM | POA: Diagnosis not present

## 2023-12-03 DIAGNOSIS — M6281 Muscle weakness (generalized): Secondary | ICD-10-CM | POA: Diagnosis not present

## 2023-12-11 ENCOUNTER — Telehealth: Payer: Self-pay

## 2023-12-11 NOTE — Telephone Encounter (Signed)
 Dr. Randol and Samule, the patient will be scheduled when she is due for her next Prolia  injection in October.  Auth Submission: APPROVED Site of care: Site of care: [facility] Payer: [employer] medicare Medication & CPT/J Code(s) submitted: Prolia  (Denosumab ) [id number] Diagnosis Code:  Route of submission (phone, fax, portal): portal Phone # Fax # Auth type: Buy/Bill PB Units/visits requested: 60mg  x 2 doses Reference number: [id number] Approval from: 12/11/23 to 12/10/24

## 2023-12-12 DIAGNOSIS — M6281 Muscle weakness (generalized): Secondary | ICD-10-CM | POA: Diagnosis not present

## 2023-12-12 DIAGNOSIS — M542 Cervicalgia: Secondary | ICD-10-CM | POA: Diagnosis not present

## 2023-12-12 DIAGNOSIS — Z9181 History of falling: Secondary | ICD-10-CM | POA: Diagnosis not present

## 2023-12-12 DIAGNOSIS — R2689 Other abnormalities of gait and mobility: Secondary | ICD-10-CM | POA: Diagnosis not present

## 2024-01-08 DIAGNOSIS — R2689 Other abnormalities of gait and mobility: Secondary | ICD-10-CM | POA: Diagnosis not present

## 2024-01-08 DIAGNOSIS — Z9181 History of falling: Secondary | ICD-10-CM | POA: Diagnosis not present

## 2024-01-08 DIAGNOSIS — M542 Cervicalgia: Secondary | ICD-10-CM | POA: Diagnosis not present

## 2024-01-08 DIAGNOSIS — M6281 Muscle weakness (generalized): Secondary | ICD-10-CM | POA: Diagnosis not present

## 2024-01-09 DIAGNOSIS — M542 Cervicalgia: Secondary | ICD-10-CM | POA: Diagnosis not present

## 2024-01-09 DIAGNOSIS — M6281 Muscle weakness (generalized): Secondary | ICD-10-CM | POA: Diagnosis not present

## 2024-01-09 DIAGNOSIS — R2689 Other abnormalities of gait and mobility: Secondary | ICD-10-CM | POA: Diagnosis not present

## 2024-01-09 DIAGNOSIS — Z9181 History of falling: Secondary | ICD-10-CM | POA: Diagnosis not present

## 2024-02-25 DIAGNOSIS — H401133 Primary open-angle glaucoma, bilateral, severe stage: Secondary | ICD-10-CM | POA: Diagnosis not present

## 2024-03-25 DIAGNOSIS — H401133 Primary open-angle glaucoma, bilateral, severe stage: Secondary | ICD-10-CM | POA: Diagnosis not present

## 2024-04-09 ENCOUNTER — Ambulatory Visit (INDEPENDENT_AMBULATORY_CARE_PROVIDER_SITE_OTHER): Admitting: *Deleted

## 2024-04-09 VITALS — BP 103/68 | HR 60 | Temp 97.7°F | Resp 16 | Ht 66.0 in | Wt 178.0 lb

## 2024-04-09 DIAGNOSIS — M810 Age-related osteoporosis without current pathological fracture: Secondary | ICD-10-CM

## 2024-04-09 MED ORDER — DENOSUMAB 60 MG/ML ~~LOC~~ SOSY
60.0000 mg | PREFILLED_SYRINGE | Freq: Once | SUBCUTANEOUS | Status: AC
  Administered 2024-04-09: 60 mg via SUBCUTANEOUS
  Filled 2024-04-09: qty 1

## 2024-04-09 NOTE — Progress Notes (Signed)
 Diagnosis: Osteoporosis  Provider:  Chilton Greathouse MD  Procedure: Injection  Prolia (Denosumab), Dose: 60 mg, Site: subcutaneous, Number of injections: 1  Injection Site(s): Left arm  Post Care: Observation period completed  Discharge: Condition: Good, Destination: Home . AVS Declined  Performed by:  Forrest Moron, RN

## 2024-05-29 ENCOUNTER — Other Ambulatory Visit: Payer: Self-pay | Admitting: Family Medicine

## 2024-05-29 DIAGNOSIS — K219 Gastro-esophageal reflux disease without esophagitis: Secondary | ICD-10-CM

## 2024-10-08 ENCOUNTER — Ambulatory Visit

## 2024-10-29 ENCOUNTER — Ambulatory Visit: Payer: Self-pay | Admitting: Family Medicine
